# Patient Record
Sex: Male | Born: 1950 | Hispanic: No | Marital: Married | Smoking: Current every day smoker
Health system: Southern US, Community
[De-identification: ages and names within clinical notes are randomized; demographics above are authoritative.]

## PROBLEM LIST (undated history)

## (undated) DIAGNOSIS — I10 Essential (primary) hypertension: Secondary | ICD-10-CM

## (undated) DIAGNOSIS — E785 Hyperlipidemia, unspecified: Secondary | ICD-10-CM

## (undated) DIAGNOSIS — I2510 Atherosclerotic heart disease of native coronary artery without angina pectoris: Secondary | ICD-10-CM

## (undated) HISTORY — PX: EYE SURGERY: SHX253

## (undated) HISTORY — PX: ELBOW SURGERY: SHX618

---

## 1998-03-19 ENCOUNTER — Emergency Department (HOSPITAL_COMMUNITY): Admission: EM | Admit: 1998-03-19 | Discharge: 1998-03-19 | Payer: Self-pay | Admitting: Emergency Medicine

## 1998-03-19 ENCOUNTER — Encounter: Payer: Self-pay | Admitting: Emergency Medicine

## 1999-11-28 ENCOUNTER — Emergency Department (HOSPITAL_COMMUNITY): Admission: EM | Admit: 1999-11-28 | Discharge: 1999-11-28 | Payer: Self-pay | Admitting: *Deleted

## 2000-09-22 ENCOUNTER — Emergency Department (HOSPITAL_COMMUNITY): Admission: EM | Admit: 2000-09-22 | Discharge: 2000-09-23 | Payer: Self-pay | Admitting: Emergency Medicine

## 2000-09-24 ENCOUNTER — Emergency Department: Admission: EM | Admit: 2000-09-24 | Discharge: 2000-09-24 | Payer: Self-pay | Admitting: Emergency Medicine

## 2001-01-13 ENCOUNTER — Emergency Department (HOSPITAL_COMMUNITY): Admission: EM | Admit: 2001-01-13 | Discharge: 2001-01-13 | Payer: Self-pay | Admitting: Emergency Medicine

## 2001-01-13 ENCOUNTER — Encounter: Payer: Self-pay | Admitting: Emergency Medicine

## 2004-04-09 ENCOUNTER — Encounter: Admission: RE | Admit: 2004-04-09 | Discharge: 2004-04-09 | Payer: Self-pay | Admitting: Gastroenterology

## 2004-06-02 ENCOUNTER — Encounter: Admission: RE | Admit: 2004-06-02 | Discharge: 2004-06-02 | Payer: Self-pay | Admitting: Cardiovascular Disease

## 2004-07-01 ENCOUNTER — Ambulatory Visit (HOSPITAL_COMMUNITY): Admission: RE | Admit: 2004-07-01 | Discharge: 2004-07-01 | Payer: Self-pay | Admitting: Cardiovascular Disease

## 2004-07-17 HISTORY — PX: CORONARY ANGIOPLASTY WITH STENT PLACEMENT: SHX49

## 2004-07-30 ENCOUNTER — Ambulatory Visit (HOSPITAL_COMMUNITY): Admission: RE | Admit: 2004-07-30 | Discharge: 2004-07-31 | Payer: Self-pay | Admitting: Cardiovascular Disease

## 2004-10-07 ENCOUNTER — Emergency Department (HOSPITAL_COMMUNITY): Admission: EM | Admit: 2004-10-07 | Discharge: 2004-10-07 | Payer: Self-pay | Admitting: Emergency Medicine

## 2004-12-04 ENCOUNTER — Emergency Department (HOSPITAL_COMMUNITY): Admission: EM | Admit: 2004-12-04 | Discharge: 2004-12-04 | Payer: Self-pay | Admitting: Emergency Medicine

## 2005-01-12 ENCOUNTER — Emergency Department (HOSPITAL_COMMUNITY): Admission: EM | Admit: 2005-01-12 | Discharge: 2005-01-12 | Payer: Self-pay | Admitting: Emergency Medicine

## 2005-01-14 ENCOUNTER — Emergency Department (HOSPITAL_COMMUNITY): Admission: EM | Admit: 2005-01-14 | Discharge: 2005-01-14 | Payer: Self-pay | Admitting: Emergency Medicine

## 2005-01-22 ENCOUNTER — Emergency Department (HOSPITAL_COMMUNITY): Admission: EM | Admit: 2005-01-22 | Discharge: 2005-01-22 | Payer: Self-pay | Admitting: Emergency Medicine

## 2006-04-17 ENCOUNTER — Encounter (INDEPENDENT_AMBULATORY_CARE_PROVIDER_SITE_OTHER): Payer: Self-pay | Admitting: Specialist

## 2006-04-17 ENCOUNTER — Ambulatory Visit (HOSPITAL_BASED_OUTPATIENT_CLINIC_OR_DEPARTMENT_OTHER): Admission: RE | Admit: 2006-04-17 | Discharge: 2006-04-17 | Payer: Self-pay | Admitting: Ophthalmology

## 2006-05-30 ENCOUNTER — Emergency Department (HOSPITAL_COMMUNITY): Admission: EM | Admit: 2006-05-30 | Discharge: 2006-05-30 | Payer: Self-pay | Admitting: Emergency Medicine

## 2007-01-26 ENCOUNTER — Emergency Department (HOSPITAL_COMMUNITY): Admission: EM | Admit: 2007-01-26 | Discharge: 2007-01-26 | Payer: Self-pay | Admitting: Emergency Medicine

## 2007-04-04 IMAGING — CR DG CHEST 2V
2 series · 2 of 2 positions shown · non-contrast
Comparison: none

CLINICAL DATA: Preop respiratory exam for angiogram.
 CHEST X-RAY:
 Two views of the chest show the lungs to be clear and slightly hyperaerated.  The heart is within normal limits in size.  No evidence of acute bony abnormality is seen.

[w chest pa]
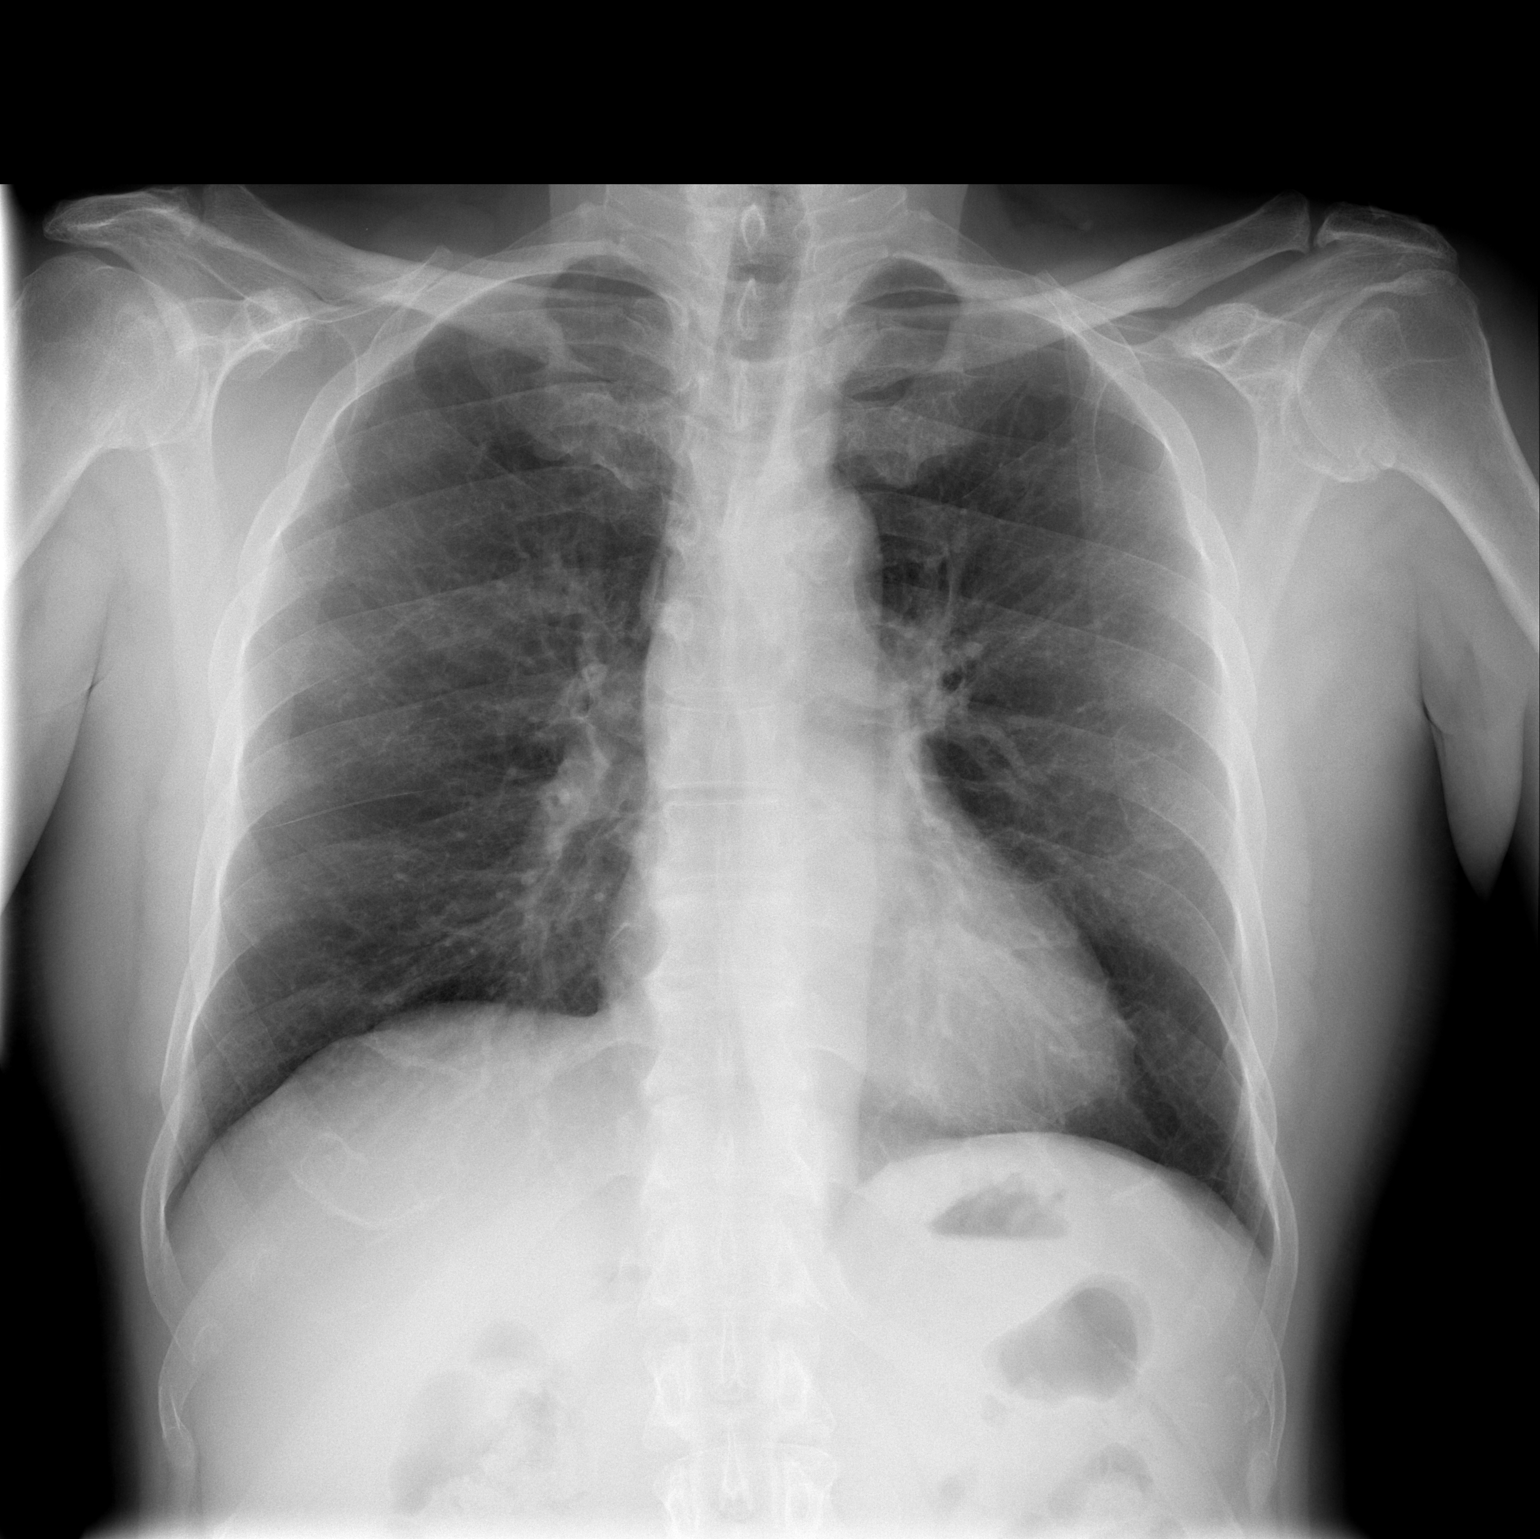

[w chest lat]
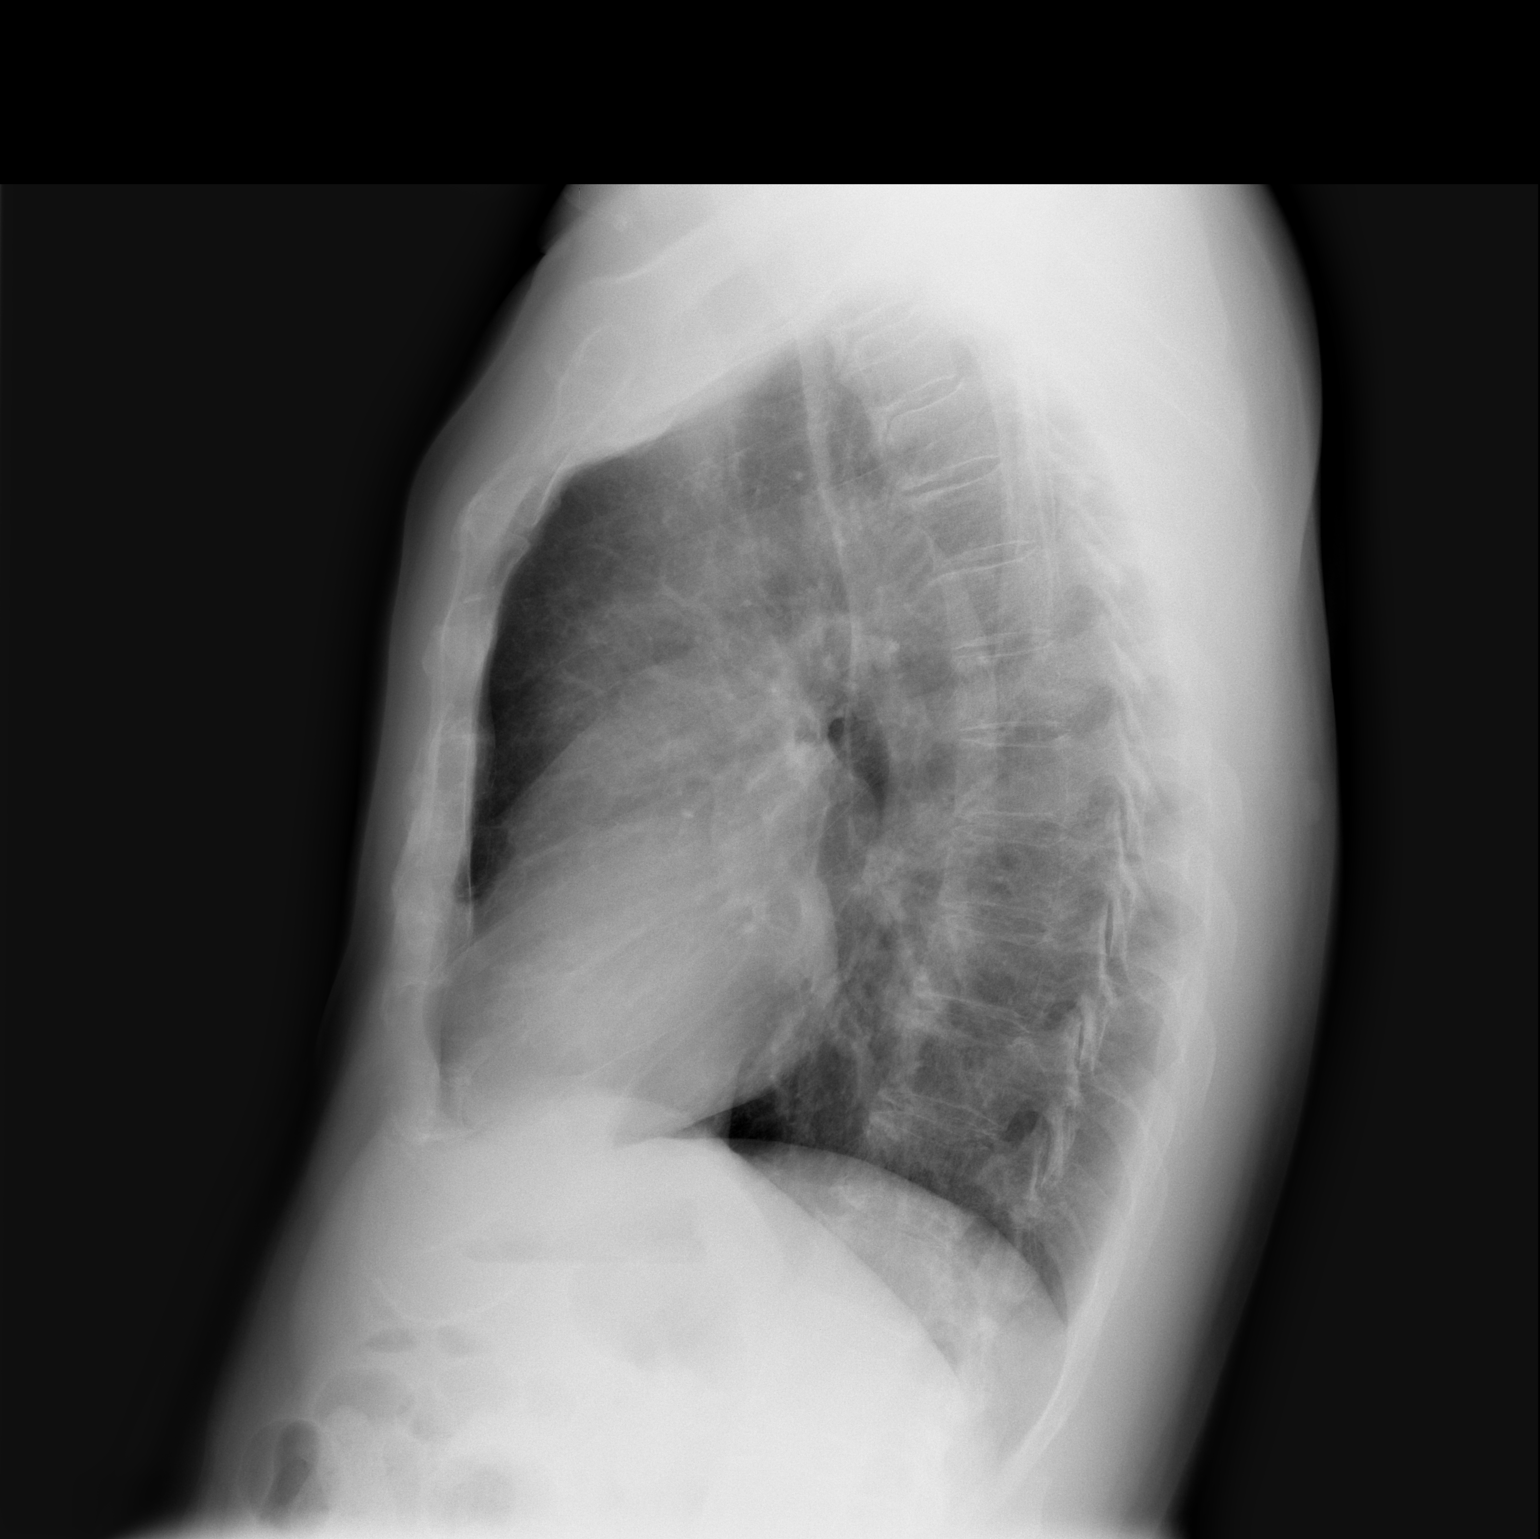

[2 of 2 positions shown; findings below may reference images not displayed]

IMPRESSION: No active lung disease.

## 2009-07-12 ENCOUNTER — Emergency Department (HOSPITAL_COMMUNITY): Admission: EM | Admit: 2009-07-12 | Discharge: 2009-07-12 | Payer: Self-pay | Admitting: Emergency Medicine

## 2009-07-30 ENCOUNTER — Encounter (INDEPENDENT_AMBULATORY_CARE_PROVIDER_SITE_OTHER): Payer: Self-pay | Admitting: Internal Medicine

## 2009-07-30 ENCOUNTER — Ambulatory Visit (HOSPITAL_COMMUNITY): Admission: RE | Admit: 2009-07-30 | Discharge: 2009-07-30 | Payer: Self-pay | Admitting: Internal Medicine

## 2010-06-04 NOTE — Cardiovascular Report (Signed)
NAMELLEWELYN, SHEAFFER               ACCOUNT NO.:  [id number]   MEDICAL RECORD NO.:  [id number]          PATIENT TYPE:  OIB   LOCATION:  2899                         FACILITY:  [facility]   PHYSICIAN:  Nanetta Batty, M.D.   DATE OF BIRTH:  01/05/1951   DATE OF PROCEDURE:  07/01/2004  DATE OF DISCHARGE:                              CARDIAC CATHETERIZATION   Mr. Voit is a 60 year old [place] gentleman referred by Dr. 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  He has had carotid Dopplers performed revealing  occluded right ICA stenosis, with moderate left ICA disease.  He is  neurologically asymptomatic.  He also complains of claudication and has  diminished ABIs, with high-grade SFA disease.  A Cardiolite stress test  shows inferior scar with peri-infarct ischemia.  The patient does complain  of definite angina and has a history of dyslipidemia.  He presents now for  diagnostic coronary arteriography +/- peripheral angiography.  Of note is  the fact that the patient's white count was 15,000, and he has complained of  some dysuria.   PROCEDURE DESCRIPTION:  The patient was brought to the second floor [facility] cardiac catheterization lab in the postabsorptive state.  He was  premedicated with p.o. Valium.  His right groin was prepped and draped in  the usual sterile fashion.  One percent Xylocaine was used for local  anesthesia.  A 6 French sheath was inserted into the right femoral artery  using standard Seldinger technique.  6 French right and left Judkins  diagnostic catheters as well as 6 French pigtail catheter were used for  selective coronary angiography, left ventriculography, subselective left  internal mammary artery angiography, and distal abdominal aortography.  Visipaque dye was used for the entirety of the case.  Retrograde aortic,  left ventricular, and pullback pressures were recorded.   HEMODYNAMIC DATA:  1.  Aortic systolic pressure 157, diastolic pressure 85.  2.  Left  ventricular systolic pressure 159, diastolic pressure 24.   SELECTIVE CORONARY ANGIOGRAPHY:  1.  Left main:  Left main had approximately 30% distal stenosis.  2.  LAD:  The LAD had an 80% segmental stenosis in the proximal third      involving a small diagonal branch.  The LAD was a large vessel that      wrapped the apex and gave right-to-left collaterals to what appears to      be an occluded RCA.  3.  Left circumflex:  Small nondominant, and free of significant disease.  4.  Ramus intermedius branch:  There are two vessels that arise from the      ramus distribution, both of which are moderate in size, distally      bifurcating, and are free of significant disease.  5.  Right coronary artery:  This is a dominant vessel that is occluded      proximally.  There are grade 1 right-to-right and grade 2 left-to-right      collaterals.   LEFT VENTRICULOGRAPHY:  RAO left ventriculogram was performed using 25 cc of  Visipaque dye at 12 cc/second.  The overall LVEF was estimated  at greater  than 60%, with very mild inferior hypokinesia.   LEFT INTERNAL MAMMARY ARTERY:  This vessel was subselectively visualized and  was widely patent.  It is suitable for use in coronary artery bypass  grafting if necessary.   DISTAL ABDOMINAL AORTOGRAPHY:  Distal abdominal aortogram was performed  using 20 cc of Visipaque dye at 20 cc/second.  The renal arteries were  widely patent.  The infrarenal abdominal aorta and iliac bifurcation  appeared free of significant atherosclerotic changes.   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  He will need PCI and  stenting of his proximal LAD.  However, given the fact that his white count  is 15,000, and the etiology of this is unclear, I would prefer to perform  only diagnostic case today, culture his urine, and treat him for a UTI, and  follow his white count prior to implanting  a stent.   The diagnostic catheters were removed.  The sheath was sewn securely in  place.  The patient received 4,000 units of heparin.  He will be brought  upstairs to the [facility] angiographic suite for distal aortography with bifemoral  runoff to define his infrainguinal anatomy.  He left the leg in stable  condition.       JB/MEDQ  D:  07/01/2004  T:  07/01/2004  Job:  [id number]   cc:   Cadiac Catheterization Laboratory  Second Floor   [facility]  [address]  [place] [address]   Fleet Contras, M.D.  [address]  [place]  [place] [address]  Fax: [phone]

## 2010-06-04 NOTE — Cardiovascular Report (Signed)
NAMEZACKARY, MCKEONE               ACCOUNT NO.:  [id number]   MEDICAL RECORD NO.:  [id number]          PATIENT TYPE:  OIB   LOCATION:  6533                         FACILITY:  [facility]   PHYSICIAN:  Nanetta Batty, M.D.   DATE OF BIRTH:  20-Sep-1950   DATE OF PROCEDURE:  07/30/2004  DATE OF DISCHARGE:                              CARDIAC CATHETERIZATION   PROCEDURE:  Percutaneous coronary intervention.   OPERATOR:  Nanetta Batty, M.D.   INDICATIONS FOR PROCEDURE:  Mr. Mcconathy is a 60 year old [place] gentleman  referred by Dr. Fleet Contras for evaluation of PVOD.  Carotid Dopplers  revealed an occluded right IC with moderate left IC stenosis.  He has  diminished ABIs and severe __________ disease by an angiography.  The  Cardiolite revealed inferior scar with moderate pre-infarction ischemia and  the cardiac catheterization revealed high-grade proximal LAD lesion,  segmental proximal ramus lesion and total RCA with left to right  collaterals.  The patient does complain of exertional chest pain and  shortness of breath.  He is assessed now for proximal LAD procedure and  stenting.   DESCRIPTION OF PROCEDURE:  The patient was brought to the second floor [facility] Cardiac Catheterization Laboratory in the post-  absorptive state.  He was premedicated with p.o. Valium.  He was on aspirin  and Plavix as an outpatient.  His right groin was prepped and shaved in the  usual sterile fashion.  Xylocaine 1% was used for local anesthesia.  A 6-  French sheath was inserted into the right femoral artery using a standard  Seldinger technique.  A 6-French sheath was inserted into the right femoral  vein.  A 6-French XP2 guide catheter, 190 Ashahi soft guide wire and a 2.75  mm x 10.0 mm long cutting balloon used for initial conduit atherectomy.  Visipaque dye was used for the entirety of the case.  Retrograde aortic  pressure was monitored throughout the case.  The patient  received an  Angiomax bolus with the ACT documented above the therapeutic range.  He will  received an additional 300 mg of Plavix.   A conduit atherectomy was performed on the proximal LAD lesion with nominal  pressures.  Following this a 3.0 mm x 15.0 mm Taxus drug-eluding stent was  then deployed to 15 or 16 atmospheres and post-dilated with a 3.25 mm x 12.0  mm Quantum, resulting in excellent angiographic result with a reduction of  the 80% proximal LAD lesion to 0% residual.  There did appear to be a distal  edge tear without hemodynamic compromise.  Because of this a 3.5 mm x 8.0 mm  Taxus was then used to distally tack down this tear and was deployed in an  overlapping fashion, resulting in excellent angiographic result.  There was  residual 70%-80% segmental proximal ramus branch stenosis; however, this was  not addressed during this procedure.   IMPRESSION:  Successful proximal left anterior descending coronary artery  conduit atherectomy followed by percutaneous coronary intervention and  stenting using Taxus over-lapping drug-eluding stents.   The patient tolerated the procedure well.  The guide wire and catheter were  removed.  The sheaths were sewn securely in place.   PLAN:  The plan will be to remove the sheaths in two hours.  The patient  will be discharged home in the morning on aspirin and Plavix.  I will see  him back in the office in approximately two weeks for followup.  He left the  laboratory in stable condition.       JB/MEDQ  D:  07/30/2004  T:  07/30/2004  Job:  [id number]   cc:   [facility] - Cardiac Cath Lab   Fleet Contras, M.D.  [address]  [place]  [place] [address]  Fax: [phone]  Email: [email or web address]

## 2010-06-04 NOTE — Cardiovascular Report (Signed)
NAMESHERRILL, MCKAMIE               ACCOUNT NO.:  [id number]   MEDICAL RECORD NO.:  [id number]          PATIENT TYPE:  OIB   LOCATION:  2899                         FACILITY:  [facility]   PHYSICIAN:  Nanetta Batty, M.D.   DATE OF BIRTH:  [birth year]   DATE OF PROCEDURE:  07/01/2004  DATE OF DISCHARGE:                              CARDIAC CATHETERIZATION   Mr. Borboa is a 60 year old [place] male referred by Dr. Concepcion Elk for  PVOD/claudication.  He also has significant carotid disease.  He has  symptoms of effort angina and a positive Cardiolite.  He underwent  diagnostic coronary angiography earlier today revealing a total right  coronary artery with left-to-right collaterals and high-proximal LAD  disease.  He does have a white count of 15,000 of unclear etiology.  He  presents for abdominal aortography with bifemoral runoff.   PROCEDURE DESCRIPTION:  The patient was brought to the sixth floor [facility] peripheral vascular angiographic suite in the postabsorptive state.  He  was premedicated with p.o. Valium.  He was given 1 g of Ancef IV.  His right  groin was prepped and shaved in the usual sterile fashion.  One percent  Xylocaine was used for local anesthesia.  The existing 6 French sheath was  exchanged over a wire using double glove sterile technique.  Number #5  [place] [employer] catheter was used for __________ distal abdominal  aortography with bifemoral runoff using digital subtraction step table  technique.   ANGIOGRAPHIC RESULTS:  1.  Abdominal aorta:  1A.  Renal artery - normal.  1B.  Infrarenal - normal.  1.  Left lower extremity:  2A.  40% ostial left common iliac artery stenosis.  2B.  50% segmental proximal, mid, and distal SFA.  2C.  Two-vessel runoff via the posterior tibialis.  1.  Right lower extremity.  3A.  40% ostial right common iliac artery stenosis.  3B.  50% diffuse segmental external iliac artery stenosis.  3C.  80% segmental proximal SFA, followed  by short segment occlusion and 50%  segment distal SFA.  There is three-vessel runoff.   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  He  will need to have his LAD fixed first, and then we will address his SFA  disease.   __________ the sheaths were removed.  Pressure on the groin achieved  hemostasis.  The patient left the lab in stable condition.  He will be  hydrated and discharged home today as an outpatient.  We will see him back  in three weeks for followup.       JB/MEDQ  D:  07/01/2004  T:  07/01/2004  Job:  [id number]   cc:   Peripheral Vascular Angiographic Suite  Sixth Floor   [facility]  [address]  [place] [address]   Fleet Contras, M.D.  [address]  [place]  [place] [address]  Fax: [phone]

## 2010-10-06 LAB — URINE MICROSCOPIC-ADD ON

## 2010-10-06 LAB — DIFFERENTIAL
Basophils Absolute: 0
Basophils Relative: 0
Eosinophils Absolute: 0.2
Neutro Abs: 9.4 — ABNORMAL HIGH
Neutrophils Relative %: 77

## 2010-10-06 LAB — COMPREHENSIVE METABOLIC PANEL
Alkaline Phosphatase: 105
BUN: 14
CO2: 26
Chloride: 109
Glucose, Bld: 107 — ABNORMAL HIGH
Potassium: 4
Total Bilirubin: 0.4

## 2010-10-06 LAB — URINALYSIS, ROUTINE W REFLEX MICROSCOPIC
Hgb urine dipstick: NEGATIVE
Ketones, ur: NEGATIVE
Leukocytes, UA: NEGATIVE
Protein, ur: 30 — AB
Urobilinogen, UA: 0.2

## 2010-10-06 LAB — CBC
HCT: 42.5
Hemoglobin: 14.3
RBC: 5.12
WBC: 12.3 — ABNORMAL HIGH

## 2010-12-20 ENCOUNTER — Emergency Department (HOSPITAL_COMMUNITY): Payer: Medicare Other

## 2010-12-20 ENCOUNTER — Other Ambulatory Visit: Payer: Self-pay

## 2010-12-20 ENCOUNTER — Emergency Department (HOSPITAL_COMMUNITY)
Admission: EM | Admit: 2010-12-20 | Discharge: 2010-12-20 | Disposition: A | Discharge status: Home / Self Care | Payer: Medicare Other | Attending: Emergency Medicine | Admitting: Emergency Medicine

## 2010-12-20 DIAGNOSIS — R509 Fever, unspecified: Secondary | ICD-10-CM | POA: Insufficient documentation

## 2010-12-20 DIAGNOSIS — R059 Cough, unspecified: Secondary | ICD-10-CM | POA: Insufficient documentation

## 2010-12-20 DIAGNOSIS — R000 Tachycardia, unspecified: Secondary | ICD-10-CM | POA: Insufficient documentation

## 2010-12-20 DIAGNOSIS — R0602 Shortness of breath: Secondary | ICD-10-CM | POA: Insufficient documentation

## 2010-12-20 DIAGNOSIS — R062 Wheezing: Secondary | ICD-10-CM | POA: Insufficient documentation

## 2010-12-20 DIAGNOSIS — R05 Cough: Secondary | ICD-10-CM | POA: Insufficient documentation

## 2010-12-20 DIAGNOSIS — J40 Bronchitis, not specified as acute or chronic: Secondary | ICD-10-CM

## 2010-12-20 HISTORY — DX: Essential (primary) hypertension: I10

## 2010-12-20 LAB — POCT I-STAT, CHEM 8
Chloride: 102 mEq/L (ref 96–112)
Glucose, Bld: 96 mg/dL (ref 70–99)
HCT: 38 % — ABNORMAL LOW (ref 39.0–52.0)
Hemoglobin: 12.9 g/dL — ABNORMAL LOW (ref 13.0–17.0)
Potassium: 3.1 mEq/L — ABNORMAL LOW (ref 3.5–5.1)
Sodium: 140 mEq/L (ref 135–145)

## 2010-12-20 MED ORDER — IPRATROPIUM BROMIDE 0.02 % IN SOLN
0.5000 mg | RESPIRATORY_TRACT | Status: AC
  Administered 2010-12-20: 0.5 mg via RESPIRATORY_TRACT
  Filled 2010-12-20: qty 2.5

## 2010-12-20 MED ORDER — ALBUTEROL SULFATE HFA 108 (90 BASE) MCG/ACT IN AERS: 1.0000 | INHALATION_SPRAY | Freq: Four times a day (QID) | RESPIRATORY_TRACT | Status: DC | PRN

## 2010-12-20 MED ORDER — ALBUTEROL SULFATE (5 MG/ML) 0.5% IN NEBU
2.5000 mg | INHALATION_SOLUTION | RESPIRATORY_TRACT | Status: AC
  Administered 2010-12-20: 2.5 mg via RESPIRATORY_TRACT
  Filled 2010-12-20: qty 0.5

## 2010-12-20 MED ORDER — POTASSIUM CHLORIDE 20 MEQ/15ML (10%) PO LIQD
40.0000 meq | Freq: Once | ORAL | Status: AC
  Administered 2010-12-20: 40 meq via ORAL
  Filled 2010-12-20: qty 30

## 2010-12-20 MED ORDER — HYDROCODONE-ACETAMINOPHEN 5-325 MG PO TABS: 2.0000 | ORAL_TABLET | ORAL | Status: AC | PRN

## 2010-12-20 MED ORDER — AZITHROMYCIN 250 MG PO TABS: 250.0000 mg | ORAL_TABLET | Freq: Every day | ORAL | Status: AC

## 2010-12-20 MED ORDER — SODIUM CHLORIDE 0.9 % IV SOLN
Freq: Once | INTRAVENOUS | Status: AC
  Administered 2010-12-20: 75 mL/h via INTRAVENOUS

## 2010-12-20 MED ORDER — ALBUTEROL SULFATE (5 MG/ML) 0.5% IN NEBU
5.0000 mg | INHALATION_SOLUTION | RESPIRATORY_TRACT | Status: AC
  Administered 2010-12-20: 5 mg via RESPIRATORY_TRACT
  Filled 2010-12-20: qty 1

## 2010-12-20 MED ORDER — METHYLPREDNISOLONE 4 MG PO KIT: PACK | ORAL | Status: AC

## 2010-12-20 MED ORDER — OXYCODONE-ACETAMINOPHEN 5-325 MG PO TABS
1.0000 | ORAL_TABLET | Freq: Once | ORAL | Status: AC
  Administered 2010-12-20: 1 via ORAL
  Filled 2010-12-20: qty 1

## 2010-12-20 MED ORDER — IBUPROFEN 800 MG PO TABS
800.0000 mg | ORAL_TABLET | Freq: Once | ORAL | Status: AC
  Administered 2010-12-20: 800 mg via ORAL
  Filled 2010-12-20: qty 1

## 2010-12-20 MED ORDER — ACETAMINOPHEN 325 MG PO TABS
650.0000 mg | ORAL_TABLET | Freq: Once | ORAL | Status: AC
  Administered 2010-12-20: 650 mg via ORAL
  Filled 2010-12-20: qty 2

## 2010-12-20 MED ORDER — IOHEXOL 300 MG/ML  SOLN
90.0000 mL | Freq: Once | INTRAMUSCULAR | Status: AC | PRN
  Administered 2010-12-20: 90 mL via INTRAVENOUS

## 2010-12-20 NOTE — ED Notes (Signed)
CDU unable to take pt at this time. No beds, will talk with RN when one is open.

## 2010-12-20 NOTE — ED Provider Notes (Signed)
2:41 PM Patient is in CDU holding for d-dimer, further evaluation and treatment.  Pt with nonproductive cough, fever, persistent tachycardia.  Pt reports he is feeling better, SOB and cough have improved.  Patient now 92% on room air.  On exam, pt is A&Ox4, NAD, RRR, lungs with mild wheezing, decreased air movement bilaterally.  I have ordered second nebulizer treatment and will put on [place].  Will continue to follow.    3:11 PM Patient discussed with Grant Fontana, PA-C, who assumes care of patient at change of shift.    Rise Patience, [place] 12/20/10 [phone]

## 2010-12-20 NOTE — ED Notes (Signed)
Neb completed. Will advise ct pt is ready for scan

## 2010-12-20 NOTE — ED Notes (Signed)
Pt states he is ready to go home . That he has been here too much time and is time to go. Pa is aware. Have tried to let pt know we are only awaiting his ct result.

## 2010-12-20 NOTE — ED Notes (Signed)
Chills, fever with a non-productive cough for 3 days, body aches

## 2010-12-20 NOTE — ED Provider Notes (Signed)
A 3 days of shortness of breath. He denies cough fever or chest pain to me. There may be some language barrier. Patient is alert and cooperative and in no respiratory distress. CT MG of chest pending because of elevated d-dimer  Medical screening examination/treatment/procedure(s) were conducted as a shared visit with non-physician practitioner(s) and myself.  I personally evaluated the patient during the encounter Devoria Albe, MD, Franz Dell, MD 12/20/10 [phone]

## 2010-12-20 NOTE — ED Notes (Signed)
Patient is resting comfortably. 

## 2010-12-20 NOTE — ED Notes (Signed)
MEAL ORDERED

## 2010-12-20 NOTE — ED Notes (Signed)
CALLED LAB TO INQUIRE ABOUT DDIMER NOT RESULTED. PER [place]TA IN LAB THEY HAVE NOT RECEIVED SAMPLE. THEY WILL CONTACT THE PHLEBOTOMIST THAT COLLECTED THE SAMPLE AND CALL BACK

## 2010-12-20 NOTE — ED Notes (Signed)
PER JAQUETTA SAMPLE WILL BE REDRAWN

## 2012-10-22 ENCOUNTER — Other Ambulatory Visit: Payer: Self-pay | Admitting: Internal Medicine

## 2012-10-22 DIAGNOSIS — J449 Chronic obstructive pulmonary disease, unspecified: Secondary | ICD-10-CM

## 2012-11-02 ENCOUNTER — Other Ambulatory Visit: Payer: Medicare Other

## 2013-04-13 ENCOUNTER — Emergency Department (HOSPITAL_COMMUNITY): Payer: Medicare Other

## 2013-04-13 ENCOUNTER — Encounter (HOSPITAL_COMMUNITY): Payer: Self-pay | Admitting: Emergency Medicine

## 2013-04-13 ENCOUNTER — Emergency Department (HOSPITAL_COMMUNITY)
Admission: EM | Admit: 2013-04-13 | Discharge: 2013-04-13 | Disposition: A | Discharge status: Home / Self Care | Payer: Medicare Other | Attending: Emergency Medicine | Admitting: Emergency Medicine

## 2013-04-13 DIAGNOSIS — J069 Acute upper respiratory infection, unspecified: Secondary | ICD-10-CM | POA: Insufficient documentation

## 2013-04-13 DIAGNOSIS — I10 Essential (primary) hypertension: Secondary | ICD-10-CM | POA: Insufficient documentation

## 2013-04-13 DIAGNOSIS — Z79899 Other long term (current) drug therapy: Secondary | ICD-10-CM | POA: Insufficient documentation

## 2013-04-13 DIAGNOSIS — Z7902 Long term (current) use of antithrombotics/antiplatelets: Secondary | ICD-10-CM | POA: Insufficient documentation

## 2013-04-13 DIAGNOSIS — R000 Tachycardia, unspecified: Secondary | ICD-10-CM | POA: Insufficient documentation

## 2013-04-13 DIAGNOSIS — J45909 Unspecified asthma, uncomplicated: Secondary | ICD-10-CM | POA: Insufficient documentation

## 2013-04-13 DIAGNOSIS — F17200 Nicotine dependence, unspecified, uncomplicated: Secondary | ICD-10-CM | POA: Insufficient documentation

## 2013-04-13 MED ORDER — BENZONATATE 100 MG PO CAPS: 100.0000 mg | ORAL_CAPSULE | Freq: Three times a day (TID) | ORAL | Status: DC

## 2013-04-13 MED ORDER — GUAIFENESIN 100 MG/5ML PO LIQD: 100.0000 mg | ORAL | Status: DC | PRN

## 2013-04-13 MED ORDER — BENZONATATE 100 MG PO CAPS
100.0000 mg | ORAL_CAPSULE | Freq: Once | ORAL | Status: AC
  Administered 2013-04-13: 100 mg via ORAL
  Filled 2013-04-13: qty 1

## 2013-04-13 NOTE — ED Provider Notes (Signed)
CSN: [id number]     Arrival date & time 04/13/13  [id number] History   First MD Initiated Contact with Patient 04/13/13 0959    This chart was scribed for Fayrene HelperBowie Mayuri Staples PA-C, a non-physician practitioner working with Gerhard Munchobert Lockwood, MD by Lewanda RifeAlexandra Hurtado, ED Scribe. This patient was seen in room TR09C/TR09C and the patient's care was started at 10:00 AM     Chief Complaint  Patient presents with  . Cough     (Consider location/radiation/quality/duration/timing/severity/associated sxs/prior Treatment) The history is provided by the patient. No language interpreter was used.   HPI Comments: Kevin Robinson is a 63 y.o. male who presents to the Emergency Department with PMHx of asthma complaining of non-productive cough onset 4 days. Reports associated subjective fever, sore throat, rhinorrhea, sneezing, insomnia,  Denies associated otalgia, rash, shortness of breath, Denies sick contacts. Reports he quit smoking 4 days ago.   Past Medical History  Diagnosis Date  . Asthma   . Hypertension    History reviewed. No pertinent past surgical history. History reviewed. No pertinent family history. History  Substance Use Topics  . Smoking status: Current Every Day Smoker  . Smokeless tobacco: Never Used  . Alcohol Use: No    Review of Systems  All other systems reviewed and are negative.      Allergies  Review of patient's allergies indicates no known allergies.  Home Medications   Current Outpatient Rx  Name  Route  Sig  Dispense  Refill  . EXPIRED: albuterol (PROVENTIL HFA;VENTOLIN HFA) 108 (90 BASE) MCG/ACT inhaler   Inhalation   Inhale 1-2 puffs into the lungs every 6 (six) hours as needed for wheezing.   1 Inhaler   0   . amLODipine (NORVASC) 10 MG tablet   Oral   Take 10 mg by mouth at bedtime.           . clopidogrel (PLAVIX) 75 MG tablet   Oral   Take 75 mg by mouth at bedtime.           Marland Kitchen. losartan-hydrochlorothiazide (HYZAAR) 100-12.5 MG per tablet   Oral  Take 1 tablet by mouth at bedtime.           . rosuvastatin (CRESTOR) 10 MG tablet   Oral   Take 10 mg by mouth at bedtime.            BP 107/58  Pulse 102  Temp(Src) 98 F (36.7 C) (Oral)  Resp 24  SpO2 96% Physical Exam  Nursing note and vitals reviewed. Constitutional: He is oriented to person, place, and time. He appears well-developed and well-nourished. No distress.  HENT:  Head: Normocephalic and atraumatic.  Mouth/Throat: Uvula is midline, oropharynx is clear and moist and mucous membranes are normal. No oropharyngeal exudate, posterior oropharyngeal edema, posterior oropharyngeal erythema or tonsillar abscesses.  Mild postnasal drainage   Eyes: EOM are normal.  Neck: Neck supple. No tracheal deviation present.  Cardiovascular: Regular rhythm.  Tachycardia present.   Pulmonary/Chest: Effort normal and breath sounds normal. No respiratory distress. He has no wheezes. He has no rales.  Musculoskeletal: Normal range of motion.  Neurological: He is alert and oriented to person, place, and time.  Skin: Skin is warm and dry.  Psychiatric: He has a normal mood and affect. His behavior is normal.    ED Course  Procedures  COORDINATION OF CARE:  Nursing notes reviewed. Vital signs reviewed. Initial pt interview and examination performed.   10:06 AM-Discussed work up plan with pt  at bedside, which includes  . Pt agrees with plan.  Pt here with URI sxs.  Has persistent cough.  No medication to cause ACEi cough.  He's on an ARB Hyzaar.  CXR neg, no hypoxia, is afebrile.  Will d/c with cough medication.  Return precaution discussed.     Treatment plan initiated:Medications - No data to display   Initial diagnostic testing ordered.     Labs Review Labs Reviewed - No data to display Imaging Review Dg Chest 2 View (if Patient Has Fever And/or Copd)  04/13/2013   CLINICAL DATA:  Productive cough.  EXAM: CHEST  2 VIEW  COMPARISON:  CT ANGIO CHEST W/CM &/OR WO/CM dated  12/20/2010; DG CHEST 2 VIEW dated 12/20/2010  FINDINGS: Lungs are clear bilaterally. Lung markings are prominent but unchanged. Heart and mediastinum are within normal limits. Atherosclerotic calcifications at the aortic arch. No acute bone abnormality.  IMPRESSION: No active cardiopulmonary disease.   Electronically Signed   By: Richarda Overlie M.D.   On: 04/13/2013 11:52     EKG Interpretation None      MDM   Final diagnoses:  URI (upper respiratory infection)   BP 107/58  Pulse 102  Temp(Src) 98 F (36.7 C) (Oral)  Resp 24  SpO2 96%  I have reviewed nursing notes and vital signs. I personally reviewed the imaging tests through PACS system  I reviewed available ER/hospitalization records thought the EMR  I personally performed the services described in this documentation, which was scribed in my presence. The recorded information has been reviewed and is accurate.     Fayrene Helper, PA-C 04/13/13 1215

## 2013-04-13 NOTE — Discharge Instructions (Signed)

## 2013-04-13 NOTE — ED Notes (Signed)
Pt reports hes had a cough, fevers and unable to sleep for the past 4 days. hes a&ox4, breathing easily now

## 2013-04-13 NOTE — ED Notes (Signed)
Xray called to get pt 

## 2013-04-13 NOTE — ED Notes (Signed)
PT ambulated with baseline gait; VSS; A&Ox3; no signs of distress; respirations even and unlabored; skin warm and dry; no questions upon discharge.  

## 2013-04-13 NOTE — ED Provider Notes (Signed)
  Medical screening examination/treatment/procedure(s) were performed by non-physician practitioner and as supervising physician I was immediately available for consultation/collaboration.   EKG Interpretation None         Gerhard Munchobert Dori Devino, MD 04/13/13 [phone]

## 2013-04-13 NOTE — ED Notes (Signed)
Patient transported to X-ray 

## 2013-10-23 ENCOUNTER — Emergency Department (HOSPITAL_COMMUNITY): Payer: Medicare Other

## 2013-10-23 ENCOUNTER — Encounter (HOSPITAL_COMMUNITY): Payer: Self-pay | Admitting: Emergency Medicine

## 2013-10-23 ENCOUNTER — Inpatient Hospital Stay (HOSPITAL_COMMUNITY)
Admission: EM | Admit: 2013-10-23 | Discharge: 2013-10-24 | DRG: 684 | Disposition: A | Discharge status: Home / Self Care | Payer: Medicare Other | Attending: Internal Medicine | Admitting: Internal Medicine

## 2013-10-23 DIAGNOSIS — Z9889 Other specified postprocedural states: Secondary | ICD-10-CM

## 2013-10-23 DIAGNOSIS — Y92009 Unspecified place in unspecified non-institutional (private) residence as the place of occurrence of the external cause: Secondary | ICD-10-CM | POA: Diagnosis not present

## 2013-10-23 DIAGNOSIS — Z79899 Other long term (current) drug therapy: Secondary | ICD-10-CM | POA: Diagnosis not present

## 2013-10-23 DIAGNOSIS — Z7902 Long term (current) use of antithrombotics/antiplatelets: Secondary | ICD-10-CM | POA: Diagnosis not present

## 2013-10-23 DIAGNOSIS — T464X5A Adverse effect of angiotensin-converting-enzyme inhibitors, initial encounter: Secondary | ICD-10-CM | POA: Diagnosis present

## 2013-10-23 DIAGNOSIS — Z6825 Body mass index (BMI) 25.0-25.9, adult: Secondary | ICD-10-CM

## 2013-10-23 DIAGNOSIS — J45909 Unspecified asthma, uncomplicated: Secondary | ICD-10-CM | POA: Diagnosis present

## 2013-10-23 DIAGNOSIS — R197 Diarrhea, unspecified: Secondary | ICD-10-CM

## 2013-10-23 DIAGNOSIS — N179 Acute kidney failure, unspecified: Principal | ICD-10-CM | POA: Diagnosis present

## 2013-10-23 DIAGNOSIS — R630 Anorexia: Secondary | ICD-10-CM | POA: Diagnosis present

## 2013-10-23 DIAGNOSIS — D72829 Elevated white blood cell count, unspecified: Secondary | ICD-10-CM | POA: Diagnosis present

## 2013-10-23 DIAGNOSIS — Z87891 Personal history of nicotine dependence: Secondary | ICD-10-CM | POA: Diagnosis not present

## 2013-10-23 DIAGNOSIS — I10 Essential (primary) hypertension: Secondary | ICD-10-CM | POA: Diagnosis present

## 2013-10-23 LAB — COMPREHENSIVE METABOLIC PANEL
ALBUMIN: 4.6 g/dL (ref 3.5–5.2)
ALK PHOS: 113 U/L (ref 39–117)
ALT: 13 U/L (ref 0–53)
ANION GAP: 19 — AB (ref 5–15)
AST: 13 U/L (ref 0–37)
BILIRUBIN TOTAL: 0.4 mg/dL (ref 0.3–1.2)
BUN: 37 mg/dL — AB (ref 6–23)
CHLORIDE: 102 meq/L (ref 96–112)
CO2: 18 meq/L — AB (ref 19–32)
Calcium: 9.9 mg/dL (ref 8.4–10.5)
Creatinine, Ser: 3.44 mg/dL — ABNORMAL HIGH (ref 0.50–1.35)
GFR calc Af Amer: 20 mL/min — ABNORMAL LOW (ref >90)
GFR calc non Af Amer: 18 mL/min — ABNORMAL LOW (ref >90)
Glucose, Bld: 155 mg/dL — ABNORMAL HIGH (ref 70–99)
POTASSIUM: 4.2 meq/L (ref 3.7–5.3)
Sodium: 139 mEq/L (ref 137–147)
Total Protein: 8.6 g/dL — ABNORMAL HIGH (ref 6.0–8.3)

## 2013-10-23 LAB — CBC WITH DIFFERENTIAL/PLATELET
BASOS PCT: 0 % (ref 0–1)
Basophils Absolute: 0 10*3/uL (ref 0.0–0.1)
Eosinophils Absolute: 0.2 10*3/uL (ref 0.0–0.7)
Eosinophils Relative: 1 % (ref 0–5)
HCT: 45.9 % (ref 39.0–52.0)
HEMOGLOBIN: 15.2 g/dL (ref 13.0–17.0)
LYMPHS ABS: 2.4 10*3/uL (ref 0.7–4.0)
LYMPHS PCT: 10 % — AB (ref 12–46)
MCH: 28.3 pg (ref 26.0–34.0)
MCHC: 33.1 g/dL (ref 30.0–36.0)
MCV: 85.5 fL (ref 78.0–100.0)
MONOS PCT: 5 % (ref 3–12)
Monocytes Absolute: 1.2 10*3/uL — ABNORMAL HIGH (ref 0.1–1.0)
NEUTROS ABS: 19.4 10*3/uL — AB (ref 1.7–7.7)
NEUTROS PCT: 84 % — AB (ref 43–77)
Platelets: 326 10*3/uL (ref 150–400)
RBC: 5.37 MIL/uL (ref 4.22–5.81)
RDW: 14.8 % (ref 11.5–15.5)
WBC: 23.3 10*3/uL — AB (ref 4.0–10.5)

## 2013-10-23 LAB — I-STAT TROPONIN, ED: Troponin i, poc: 0.01 ng/mL (ref 0.00–0.08)

## 2013-10-23 LAB — LIPASE, BLOOD: Lipase: 37 U/L (ref 11–59)

## 2013-10-23 MED ORDER — MORPHINE SULFATE 4 MG/ML IJ SOLN
4.0000 mg | Freq: Once | INTRAMUSCULAR | Status: AC
  Administered 2013-10-23: 4 mg via INTRAVENOUS
  Filled 2013-10-23: qty 1

## 2013-10-23 MED ORDER — ONDANSETRON HCL 4 MG/2ML IJ SOLN
4.0000 mg | Freq: Once | INTRAMUSCULAR | Status: AC
  Administered 2013-10-23: 4 mg via INTRAVENOUS
  Filled 2013-10-23: qty 2

## 2013-10-23 MED ORDER — SODIUM CHLORIDE 0.9 % IV BOLUS (SEPSIS)
1000.0000 mL | Freq: Once | INTRAVENOUS | Status: AC
  Administered 2013-10-23: 1000 mL via INTRAVENOUS

## 2013-10-23 MED ORDER — SODIUM CHLORIDE 0.9 % IV BOLUS (SEPSIS): 1000.0000 mL | Freq: Once | INTRAVENOUS | Status: DC

## 2013-10-23 NOTE — H&P (Signed)
[facility] History and Physical  Kevin NunnerySharif A Carlo HYQ:[id number] DOB: 10/13/1950 DOA: 10/23/2013  Referring physician: Azalia BilisKevin Campos, MD PCP: No primary provider on file.   Chief Complaint: Diarrhea  HPI: Kevin Robinson is a 63 y.o. male presents with diarrhea. He states that he was his usual health until the last three days. Patient states he has been having diarrhea which is watery. He states that he has no mucus and no blood noted in the stool. He also has had a loss of appetite. In addition to this he has had some abdominal cramping. Patient has not noted fevers. He denies any sick contacts. He has not traveled abroad. He states that he has only been taking his usual medications for his blood pressure. He has no headaches and no chest pain and no abdominal pain.   Review of Systems:  Constitutional:  No weight loss, night sweats, Fevers, chills, ++fatigue.  HEENT:  No headaches, No sneezing, itching, ear ache, nasal congestion, post nasal drip,  Cardio-vascular:  No chest pain, Orthopnea, PND, swelling in lower extremities  GI:  No heartburn, indigestion, abdominal pain, nausea, vomiting, ++diarrhea, ++change in bowel habits, ++loss of appetite  Resp:  No shortness of breath with exertion or at rest. No excess mucus, no productive cough, No non-productive cough, No coughing up of blood Skin:  no rash or lesions GU:  no dysuria, change in color of urine.  Musculoskeletal:  No joint pain or swelling. No decreased range of motion. No back pain.  Psych:  No change in mood or affect. No depression or anxiety. No memory loss.   Past Medical History  Diagnosis Date  . Asthma   . Hypertension    Past Surgical History  Procedure Laterality Date  . Cardiac catheterization      10 years ago   Social History:  reports that he has quit smoking. He has never used smokeless tobacco. He reports that he does not drink alcohol or use illicit drugs.  No Known Allergies  No family  history on file.   Prior to Admission medications   Medication Sig Start Date End Date Taking? Authorizing Provider  amLODipine (NORVASC) 10 MG tablet Take 10 mg by mouth at bedtime.     Yes Historical Provider, MD  clopidogrel (PLAVIX) 75 MG tablet Take 75 mg by mouth at bedtime.     Yes Historical Provider, MD  losartan-hydrochlorothiazide (HYZAAR) 100-25 MG per tablet Take 1 tablet by mouth at bedtime.    Yes Historical Provider, MD  rosuvastatin (CRESTOR) 10 MG tablet Take 10 mg by mouth at bedtime.     Yes Historical Provider, MD   Physical Exam: Filed Vitals:   10/23/13 2006 10/23/13 2105  BP: 119/61   Pulse: 96   Temp: 97.5 F (36.4 C)   TempSrc: Oral   Resp: 18   Height:  5\' 7"  (1.702 m)  Weight:  74.844 kg (165 lb)  SpO2: 97%     Wt Readings from Last 3 Encounters:  10/23/13 74.844 kg (165 lb)    General:  Appears calm and comfortable Eyes: PERRL, normal lids, irises & conjunctiva ENT: grossly normal hearing, lips & tongue Neck: no LAD, masses or thyromegaly Cardiovascular: RRR, no m/r/g. No LE edema. Respiratory: CTA bilaterally, no w/r/r. Normal respiratory effort. Abdomen: soft, ntnd Skin: no rash or induration seen on limited exam Musculoskeletal: grossly normal tone BUE/BLE Psychiatric: grossly normal mood and affect, speech fluent and appropriate Neurologic: grossly non-focal.  Labs on Admission:  Basic Metabolic Panel:  Recent Labs Lab 10/23/13 2120  NA 139  K 4.2  CL 102  CO2 18*  GLUCOSE 155*  BUN 37*  CREATININE 3.44*  CALCIUM 9.9   Liver Function Tests:  Recent Labs Lab 10/23/13 2120  AST 13  ALT 13  ALKPHOS 113  BILITOT 0.4  PROT 8.6*  ALBUMIN 4.6    Recent Labs Lab 10/23/13 2120  LIPASE 37   No results found for this basename: AMMONIA,  in the last 168 hours CBC:  Recent Labs Lab 10/23/13 2120  WBC 23.3*  NEUTROABS 19.4*  HGB 15.2  HCT 45.9  MCV 85.5  PLT 326   Cardiac Enzymes: No results found for  this basename: CKTOTAL, CKMB, CKMBINDEX, TROPONINI,  in the last 168 hours  BNP (last 3 results) No results found for this basename: PROBNP,  in the last 8760 hours CBG: No results found for this basename: GLUCAP,  in the last 168 hours  Radiological Exams on Admission: Dg Chest 2 View  10/23/2013   CLINICAL DATA:  Very and vomiting for 3 days. Initial encounter. History of hypertension.  EXAM: CHEST  2 VIEW  COMPARISON:  Two-view chest 04/13/2013.  FINDINGS: Heart size is normal. The lungs are clear. Emphysematous changes again noted. The visualized soft tissues and bony thorax are unremarkable.  IMPRESSION: No active cardiopulmonary disease.   Electronically Signed   By: Gennette Pac M.D.   On: 10/23/2013 21:53      Assessment/Plan Active Problems:   AKI (acute kidney injury)   Hypertension   Asthma   Diarrhea   Acute renal failure   1. Acute Renal Failure -likely related to his diarrhea and poor PO intake for the last 3 days -will hydrate with IV NS and monitor labs -monitor IOs -if there is no improvement then consider further renal workup  2. Diarrhea -will start on oral cipro -will check stool O&P -will check stool for C Diff also -again hydration ordered  3. ASthma -clinically stable  -will monitor  4. Hypertension -will continue with home medications   Code Status: Full Code (must indicate code status--if unknown or must be presumed, indicate so) DVT Prophylaxis:Heparin Family Communication: Daughter (indicate person spoken with, if applicable, with phone number if by telephone) Disposition Plan: Home (indicate anticipated LOS)  Time spent:  [facility] Pager [phone]

## 2013-10-23 NOTE — ED Provider Notes (Signed)
CSN: [id number]     Arrival date & time 10/23/13  1948 History   First MD Initiated Contact with Patient 10/23/13 2229     Chief Complaint  Patient presents with  . Diarrhea     (Consider location/radiation/quality/duration/timing/severity/associated sxs/prior Treatment) HPI Comments: Patient is a 63 year old male with history of asthma and hypertension who presents to the emergency department for evaluation of 3 days of diarrhea. He reports that he has been having watery diarrhea. He began to have emesis today. He has associated heartburn and began to lose his voice today. He denies any unusual food intake, recent travel, recent antibiotics. He has history of cardiac cath 10 years ago. He was having an echo done by his primary care physician one month ago which she was told was normal.  The history is provided by the patient and a relative. The history is limited by a language barrier. A language interpreter was used.    Past Medical History  Diagnosis Date  . Asthma   . Hypertension    Past Surgical History  Procedure Laterality Date  . Cardiac catheterization      10 years ago   No family history on file. History  Substance Use Topics  . Smoking status: Former Games developer  . Smokeless tobacco: Never Used  . Alcohol Use: No    Review of Systems  Constitutional: Negative for fever and chills.  Respiratory: Negative for shortness of breath.   Cardiovascular: Positive for chest pain (burning).  Gastrointestinal: Positive for nausea, vomiting, abdominal pain and diarrhea.  Genitourinary: Negative for dysuria and frequency.  All other systems reviewed and are negative.     Allergies  Review of patient's allergies indicates no known allergies.  Home Medications   Prior to Admission medications   Medication Sig Start Date End Date Taking? Authorizing Provider  amLODipine (NORVASC) 10 MG tablet Take 10 mg by mouth at bedtime.     Yes Historical Provider, MD  clopidogrel  (PLAVIX) 75 MG tablet Take 75 mg by mouth at bedtime.     Yes Historical Provider, MD  losartan-hydrochlorothiazide (HYZAAR) 100-25 MG per tablet Take 1 tablet by mouth at bedtime.    Yes Historical Provider, MD  rosuvastatin (CRESTOR) 10 MG tablet Take 10 mg by mouth at bedtime.     Yes Historical Provider, MD   BP 119/61  Pulse 96  Temp(Src) 97.5 F (36.4 C) (Oral)  Resp 18  Ht 5\' 7"  (1.702 m)  Wt 165 lb (74.844 kg)  BMI 25.84 kg/m2  SpO2 97% Physical Exam  Nursing note and vitals reviewed. Constitutional: He is oriented to person, place, and time. He appears well-developed and well-nourished. He appears ill. No distress.  HENT:  Head: Normocephalic and atraumatic.  Right Ear: External ear normal.  Left Ear: External ear normal.  Nose: Nose normal.  Mouth/Throat: Mucous membranes are dry.  Eyes: Conjunctivae are normal.  Neck: Normal range of motion. No tracheal deviation present.  Cardiovascular: Normal rate, regular rhythm and normal heart sounds.   Pulmonary/Chest: Effort normal and breath sounds normal. No stridor.  Abdominal: Soft. Bowel sounds are normal. He exhibits no distension. There is no tenderness. There is no rigidity, no rebound and no guarding.  Musculoskeletal: Normal range of motion.  Neurological: He is alert and oriented to person, place, and time.  Skin: Skin is warm and dry. He is not diaphoretic.  Psychiatric: He has a normal mood and affect. His behavior is normal.    ED Course  Procedures (  including critical care time) Labs Review Labs Reviewed  CBC WITH DIFFERENTIAL - Abnormal; Notable for the following:    WBC 23.3 (*)    Neutrophils Relative % 84 (*)    Neutro Abs 19.4 (*)    Lymphocytes Relative 10 (*)    Monocytes Absolute 1.2 (*)    All other components within normal limits  COMPREHENSIVE METABOLIC PANEL - Abnormal; Notable for the following:    CO2 18 (*)    Glucose, Bld 155 (*)    BUN 37 (*)    Creatinine, Ser 3.44 (*)    Total  Protein 8.6 (*)    GFR calc non Af Amer 18 (*)    GFR calc Af Amer 20 (*)    Anion gap 19 (*)    All other components within normal limits  URINALYSIS, ROUTINE W REFLEX MICROSCOPIC - Abnormal; Notable for the following:    Color, Urine AMBER (*)    APPearance TURBID (*)    Bilirubin Urine MODERATE (*)    Protein, ur 100 (*)    Leukocytes, UA TRACE (*)    All other components within normal limits  URINE MICROSCOPIC-ADD ON - Abnormal; Notable for the following:    Bacteria, UA MANY (*)    Casts HYALINE CASTS (*)    All other components within normal limits  URINE CULTURE  CLOSTRIDIUM DIFFICILE BY PCR  OVA AND PARASITE EXAMINATION  LIPASE, BLOOD  TSH  HEMOGLOBIN A1C  COMPREHENSIVE METABOLIC PANEL  CBC  I-STAT TROPOININ, ED    Imaging Review Dg Chest 2 View  10/23/2013   CLINICAL DATA:  Very and vomiting for 3 days. Initial encounter. History of hypertension.  EXAM: CHEST  2 VIEW  COMPARISON:  Two-view chest 04/13/2013.  FINDINGS: Heart size is normal. The lungs are clear. Emphysematous changes again noted. The visualized soft tissues and bony thorax are unremarkable.  IMPRESSION: No active cardiopulmonary disease.   Electronically Signed   By: Gennette Pachris  Mattern M.D.   On: 10/23/2013 21:53     EKG Interpretation   Date/Time:  Wednesday October 23 2013 21:18:22 EDT Ventricular Rate:  80 PR Interval:  110 QRS Duration: 91 QT Interval:  398 QTC Calculation: 459 R Axis:   62 Text Interpretation:  Sinus rhythm Borderline short PR interval LAE,  consider biatrial enlargement Probable left ventricular hypertrophy  Nonspecific T abnormalities, lateral leads Baseline wander in lead(s) V1  No significant change was found Confirmed by CAMPOS  MD, Caryn BeeKEVIN ([id number]) on  10/23/2013 10:52:30 PM      MDM   Final diagnoses:  AKI (acute kidney injury)  Diarrhea  Essential hypertension    Patient presents emergency department for 3 days of diarrhea. No risk factors for c diff, but culture  was sent. Patient with new acute renal failure. Patient given 2 L of NS in ED. Patient admitted to medicine for further management. Admission is appreciated. Vital signs stable at this time.   Mora BellmanHannah S Laurance Heide, PA-C 10/24/13 0221

## 2013-10-23 NOTE — ED Provider Notes (Signed)
BUN  Date Value Ref Range Status  10/23/2013 37* 6 - 23 mg/dL Final  16/1/096012/03/2010 15  6 - 23 mg/dL Final  4/5/40981/09/2007 14   Final   Creatinine, Ser  Date Value Ref Range Status  10/23/2013 3.44* 0.50 - 1.35 mg/dL Final  11/9/147812/03/2010 2.951.20  0.50 - 1.35 mg/dL Final  6/2/13081/09/2007 6.570.80   Final       Lyanne CoKevin M Briarrose Shor, MD 10/23/13 2313

## 2013-10-23 NOTE — ED Notes (Signed)
Pt presents with family, pt speaks very limited [place]. Per family pt c/o diarrhea x 2-3 days with emesis today. Pt reports heart burn and voice became hoarse today.

## 2013-10-24 LAB — URINE CULTURE
COLONY COUNT: NO GROWTH
Culture: NO GROWTH

## 2013-10-24 LAB — HEMOGLOBIN A1C
HEMOGLOBIN A1C: 6.2 % — AB (ref <5.7)
Mean Plasma Glucose: 131 mg/dL — ABNORMAL HIGH (ref <117)

## 2013-10-24 LAB — COMPREHENSIVE METABOLIC PANEL
ALBUMIN: 4 g/dL (ref 3.5–5.2)
ALK PHOS: 103 U/L (ref 39–117)
ALT: 11 U/L (ref 0–53)
AST: 11 U/L (ref 0–37)
Anion gap: 15 (ref 5–15)
BILIRUBIN TOTAL: 0.4 mg/dL (ref 0.3–1.2)
BUN: 42 mg/dL — ABNORMAL HIGH (ref 6–23)
CHLORIDE: 104 meq/L (ref 96–112)
CO2: 19 meq/L (ref 19–32)
Calcium: 9.2 mg/dL (ref 8.4–10.5)
Creatinine, Ser: 2.64 mg/dL — ABNORMAL HIGH (ref 0.50–1.35)
GFR calc Af Amer: 28 mL/min — ABNORMAL LOW (ref >90)
GFR, EST NON AFRICAN AMERICAN: 24 mL/min — AB (ref >90)
Glucose, Bld: 108 mg/dL — ABNORMAL HIGH (ref 70–99)
POTASSIUM: 3.9 meq/L (ref 3.7–5.3)
SODIUM: 138 meq/L (ref 137–147)
Total Protein: 7.6 g/dL (ref 6.0–8.3)

## 2013-10-24 LAB — URINALYSIS, ROUTINE W REFLEX MICROSCOPIC
Glucose, UA: NEGATIVE mg/dL
HGB URINE DIPSTICK: NEGATIVE
Ketones, ur: NEGATIVE mg/dL
Nitrite: NEGATIVE
PROTEIN: 100 mg/dL — AB
Specific Gravity, Urine: 1.026 (ref 1.005–1.030)
UROBILINOGEN UA: 0.2 mg/dL (ref 0.0–1.0)
pH: 5 (ref 5.0–8.0)

## 2013-10-24 LAB — CBC
HEMATOCRIT: 42 % (ref 39.0–52.0)
Hemoglobin: 13.7 g/dL (ref 13.0–17.0)
MCH: 27.3 pg (ref 26.0–34.0)
MCHC: 32.6 g/dL (ref 30.0–36.0)
MCV: 83.8 fL (ref 78.0–100.0)
PLATELETS: 312 10*3/uL (ref 150–400)
RBC: 5.01 MIL/uL (ref 4.22–5.81)
RDW: 14.7 % (ref 11.5–15.5)
WBC: 14.9 10*3/uL — ABNORMAL HIGH (ref 4.0–10.5)

## 2013-10-24 LAB — URINE MICROSCOPIC-ADD ON

## 2013-10-24 LAB — TSH: TSH: 0.861 u[IU]/mL (ref 0.350–4.500)

## 2013-10-24 MED ORDER — CIPROFLOXACIN HCL 500 MG PO TABS
500.0000 mg | ORAL_TABLET | Freq: Two times a day (BID) | ORAL | Status: DC
  Administered 2013-10-24: 500 mg via ORAL
  Filled 2013-10-24 (×4): qty 1

## 2013-10-24 MED ORDER — ADULT MULTIVITAMIN W/MINERALS CH
1.0000 | ORAL_TABLET | Freq: Every day | ORAL | Status: DC
  Administered 2013-10-24: 1 via ORAL
  Filled 2013-10-24: qty 1

## 2013-10-24 MED ORDER — FOLIC ACID 1 MG PO TABS
1.0000 mg | ORAL_TABLET | Freq: Every day | ORAL | Status: DC
  Administered 2013-10-24: 1 mg via ORAL
  Filled 2013-10-24: qty 1

## 2013-10-24 MED ORDER — ACETAMINOPHEN 650 MG RE SUPP: 650.0000 mg | Freq: Four times a day (QID) | RECTAL | Status: DC | PRN

## 2013-10-24 MED ORDER — ONDANSETRON HCL 4 MG PO TABS: 4.0000 mg | ORAL_TABLET | Freq: Four times a day (QID) | ORAL | Status: DC | PRN

## 2013-10-24 MED ORDER — ENSURE COMPLETE PO LIQD
237.0000 mL | Freq: Two times a day (BID) | ORAL | Status: DC
  Administered 2013-10-24: 237 mL via ORAL

## 2013-10-24 MED ORDER — CLOPIDOGREL BISULFATE 75 MG PO TABS
75.0000 mg | ORAL_TABLET | Freq: Every day | ORAL | Status: DC
  Filled 2013-10-24 (×2): qty 1

## 2013-10-24 MED ORDER — VITAMIN B-1 100 MG PO TABS
100.0000 mg | ORAL_TABLET | Freq: Every day | ORAL | Status: DC
  Administered 2013-10-24: 100 mg via ORAL
  Filled 2013-10-24: qty 1

## 2013-10-24 MED ORDER — ROSUVASTATIN CALCIUM 10 MG PO TABS
10.0000 mg | ORAL_TABLET | Freq: Every day | ORAL | Status: DC
  Filled 2013-10-24 (×2): qty 1

## 2013-10-24 MED ORDER — CIPROFLOXACIN HCL 500 MG PO TABS: 500.0000 mg | ORAL_TABLET | Freq: Two times a day (BID) | ORAL | Status: DC

## 2013-10-24 MED ORDER — LOSARTAN POTASSIUM 50 MG PO TABS
100.0000 mg | ORAL_TABLET | Freq: Every day | ORAL | Status: DC
  Filled 2013-10-24 (×2): qty 2

## 2013-10-24 MED ORDER — HYDROCHLOROTHIAZIDE 25 MG PO TABS
25.0000 mg | ORAL_TABLET | Freq: Every day | ORAL | Status: DC
  Filled 2013-10-24 (×2): qty 1

## 2013-10-24 MED ORDER — SODIUM CHLORIDE 0.9 % IV SOLN
INTRAVENOUS | Status: DC
  Administered 2013-10-24: 01:00:00 via INTRAVENOUS

## 2013-10-24 MED ORDER — HEPARIN SODIUM (PORCINE) 5000 UNIT/ML IJ SOLN
5000.0000 [IU] | Freq: Three times a day (TID) | INTRAMUSCULAR | Status: DC
  Administered 2013-10-24: 5000 [IU] via SUBCUTANEOUS
  Filled 2013-10-24 (×4): qty 1

## 2013-10-24 MED ORDER — OXYCODONE HCL 5 MG PO TABS: 5.0000 mg | ORAL_TABLET | ORAL | Status: DC | PRN

## 2013-10-24 MED ORDER — ACETAMINOPHEN 325 MG PO TABS: 650.0000 mg | ORAL_TABLET | Freq: Four times a day (QID) | ORAL | Status: DC | PRN

## 2013-10-24 MED ORDER — METOPROLOL TARTRATE 12.5 MG HALF TABLET: 50.0000 mg | ORAL_TABLET | Freq: Two times a day (BID) | ORAL | Status: DC

## 2013-10-24 MED ORDER — LOSARTAN POTASSIUM-HCTZ 100-25 MG PO TABS: 1.0000 | ORAL_TABLET | Freq: Every day | ORAL | Status: DC

## 2013-10-24 MED ORDER — METRONIDAZOLE 500 MG PO TABS: 500.0000 mg | ORAL_TABLET | Freq: Three times a day (TID) | ORAL | Status: DC

## 2013-10-24 MED ORDER — AMLODIPINE BESYLATE 10 MG PO TABS
10.0000 mg | ORAL_TABLET | Freq: Every day | ORAL | Status: DC
  Filled 2013-10-24 (×2): qty 1

## 2013-10-24 MED ORDER — ONDANSETRON HCL 4 MG/2ML IJ SOLN: 4.0000 mg | Freq: Four times a day (QID) | INTRAMUSCULAR | Status: DC | PRN

## 2013-10-24 NOTE — Discharge Summary (Signed)
Physician Discharge Summary  Kevin NunnerySharif A Jacko MWN:[id number] DOB: 10/13/1950 DOA: 10/23/2013  PCP: No primary provider on file.  Admit date: 10/23/2013 Discharge date: 10/24/2013  Time spent: *50 minutes  Recommendations for Outpatient Follow-up:  1. *Follow up PCP in one week, get blood work in one week  Discharge Diagnoses:  Active Problems:   AKI (acute kidney injury)   Hypertension   Asthma   Diarrhea   Acute renal failure   Discharge Condition: Stable  Diet recommendation: Regular diet  Filed Weights   10/23/13 2105  Weight: 74.844 kg (165 lb)    History of present illness:  63 y.o. male presents with diarrhea. He states that he was his usual health until the last three days. Patient states he has been having diarrhea which is watery. He states that he has no mucus and no blood noted in the stool. He also has had a loss of appetite. In addition to this he has had some abdominal cramping. Patient has not noted fevers. He denies any sick contacts. He has not traveled abroad. He states that he has only been taking his usual medications for his blood pressure. He has no headaches and no chest pain and no abdominal pain.   Hospital Course:   Acute kidney injury Multifactorial, due to diarrhea, and also medication induced. He was taking Lisinopril/hctz at home. He has received IV fluids in the hospital and the creatinine has improved to 2.64, he does not wantb to wait for another day. Will discharge him home. Will discontinue lisinopril/hctz. He will need repeat bmp in one week.  Hypertension will continue with amlodipine and start metoprolol 12.5 mg po BID  Diarrhea Resolved, no BM since he came to the hospital, likely viral. No sample could be obtained, he was started empirically on cipro and flagyl. Will discharge on Cipro and Flagyl for five more days.  Leukocytosis Improved, wbc today is 14000. Likely due to diarrhea and  dehydration.  Procedures:  None  Consultations:  None  Discharge Exam: Filed Vitals:   10/24/13 0630  BP: 113/63  Pulse: 85  Temp: 98.5 F (36.9 C)  Resp: 18    General: Appear in no acute distress Cardiovascular: S1s2 RRR Respiratory: Clear bilaterally  Discharge Instructions You were cared for by a hospitalist during your hospital stay. If you have any questions about your discharge medications or the care you received while you were in the hospital after you are discharged, you can call the unit and asked to speak with the hospitalist on call if the hospitalist that took care of you is not available. Once you are discharged, your primary care physician will handle any further medical issues. Please note that NO REFILLS for any discharge medications will be authorized once you are discharged, as it is imperative that you return to your primary care physician (or establish a relationship with a primary care physician if you do not have one) for your aftercare needs so that they can reassess your need for medications and monitor your lab values.   Current Discharge Medication List    START taking these medications   Details  ciprofloxacin (CIPRO) 500 MG tablet Take 1 tablet (500 mg total) by mouth 2 (two) times daily. Qty: 10 tablet, Refills: 0    metoprolol tartrate (LOPRESSOR) 12.5 mg TABS tablet Take 2 tablets (50 mg total) by mouth 2 (two) times daily. Qty: 60 tablet, Refills: 2    metroNIDAZOLE (FLAGYL) 500 MG tablet Take 1 tablet (500 mg total)  by mouth 3 (three) times daily. Qty: 15 tablet, Refills: 0      CONTINUE these medications which have NOT CHANGED   Details  amLODipine (NORVASC) 10 MG tablet Take 10 mg by mouth at bedtime.      clopidogrel (PLAVIX) 75 MG tablet Take 75 mg by mouth at bedtime.      rosuvastatin (CRESTOR) 10 MG tablet Take 10 mg by mouth at bedtime.        STOP taking these medications     losartan-hydrochlorothiazide (HYZAAR) 100-25  MG per tablet        No Known Allergies    The results of significant diagnostics from this hospitalization (including imaging, microbiology, ancillary and laboratory) are listed below for reference.    Significant Diagnostic Studies: Dg Chest 2 View  10/23/2013   CLINICAL DATA:  Very and vomiting for 3 days. Initial encounter. History of hypertension.  EXAM: CHEST  2 VIEW  COMPARISON:  Two-view chest 04/13/2013.  FINDINGS: Heart size is normal. The lungs are clear. Emphysematous changes again noted. The visualized soft tissues and bony thorax are unremarkable.  IMPRESSION: No active cardiopulmonary disease.   Electronically Signed   By: Gennette Pac M.D.   On: 10/23/2013 21:53    Microbiology: No results found for this or any previous visit (from the past 240 hour(s)).   Labs: Basic Metabolic Panel:  Recent Labs Lab 10/23/13 2120 10/24/13 0423  NA 139 138  K 4.2 3.9  CL 102 104  CO2 18* 19  GLUCOSE 155* 108*  BUN 37* 42*  CREATININE 3.44* 2.64*  CALCIUM 9.9 9.2   Liver Function Tests:  Recent Labs Lab 10/23/13 2120 10/24/13 0423  AST 13 11  ALT 13 11  ALKPHOS 113 103  BILITOT 0.4 0.4  PROT 8.6* 7.6  ALBUMIN 4.6 4.0    Recent Labs Lab 10/23/13 2120  LIPASE 37   No results found for this basename: AMMONIA,  in the last 168 hours CBC:  Recent Labs Lab 10/23/13 2120 10/24/13 0423  WBC 23.3* 14.9*  NEUTROABS 19.4*  --   HGB 15.2 13.7  HCT 45.9 42.0  MCV 85.5 83.8  PLT 326 312   Cardiac Enzymes: No results found for this basename: CKTOTAL, CKMB, CKMBINDEX, TROPONINI,  in the last 168 hours BNP: BNP (last 3 results) No results found for this basename: PROBNP,  in the last 8760 hours CBG: No results found for this basename: GLUCAP,  in the last 168 hours     Signed:  Lindalee Huizinga S  [facility] 10/24/2013, 4:24 PM

## 2013-10-24 NOTE — Progress Notes (Signed)
INITIAL NUTRITION ASSESSMENT  DOCUMENTATION CODES Per approved criteria  -Not Applicable   INTERVENTION: - Ensure Complete BID - Encouraged continued excellent meal intake - RD to continue to monitor   NUTRITION DIAGNOSIS: Unintended weight loss related to diarrhea and poor appetite as evidenced by wife's report.    Goal: Pt to consume >90% of meals/supplements  Monitor:  Weights, labs, intake  Reason for Assessment: Malnutrition screening tool   63 y.o. male  Admitting Dx: Diarrhea  ASSESSMENT: Admitted with 3 days of watery diarrhea, found to have acute renal failure.   - Met with pt and wife who report pt couldn't eat much of anything since Monday however before then had a good appetite and ate 3 meals/day that were well balanced - Denies any diarrhea today and reports he ate 100% of his breakfast - Wife suspects pt may have lost a few pounds since he's been sick    Height: Ht Readings from Last 1 Encounters:  10/23/13 $RemoveB'5\' 7"'HVYtORRs$  (1.702 m)    Weight: Wt Readings from Last 1 Encounters:  10/23/13 165 lb (74.844 kg)    Ideal Body Weight: 148 lbs   % Ideal Body Weight: 111%  Wt Readings from Last 10 Encounters:  10/23/13 165 lb (74.844 kg)    Usual Body Weight: Pt unsure   BMI:  Body mass index is 25.84 kg/(m^2).  Estimated Nutritional Needs: Kcal: 1600-1800 Protein: 75-90g Fluid: 1.6-1.8L/day   Skin: intact   Diet Order: Cardiac  EDUCATION NEEDS: -No education needs identified at this time   Intake/Output Summary (Last 24 hours) at 10/24/13 1115 Last data filed at 10/24/13 1000  Gross per 24 hour  Intake 1376.25 ml  Output      0 ml  Net 1376.25 ml    Last BM: 10/7  Labs:   Recent Labs Lab 10/23/13 2120 10/24/13 0423  NA 139 138  K 4.2 3.9  CL 102 104  CO2 18* 19  BUN 37* 42*  CREATININE 3.44* 2.64*  CALCIUM 9.9 9.2  GLUCOSE 155* 108*    CBG (last 3)  No results found for this basename: GLUCAP,  in the last 72  hours  Scheduled Meds: . amLODipine  10 mg Oral QHS  . ciprofloxacin  500 mg Oral BID  . clopidogrel  75 mg Oral QHS  . folic acid  1 mg Oral Daily  . heparin  5,000 Units Subcutaneous 3 times per day  . losartan  100 mg Oral QHS   And  . hydrochlorothiazide  25 mg Oral QHS  . multivitamin with minerals  1 tablet Oral Daily  . rosuvastatin  10 mg Oral QHS  . thiamine  100 mg Oral Daily    Continuous Infusions: . sodium chloride 75 mL/hr at 10/24/13 0115    Past Medical History  Diagnosis Date  . Asthma   . Hypertension     Past Surgical History  Procedure Laterality Date  . Cardiac catheterization      10 years ago     Carlis Stable MS, [place], [place] Pager [phone] Weekend/After Hours Pager

## 2013-10-26 NOTE — ED Provider Notes (Signed)
Medical screening examination/treatment/procedure(s) were performed by non-physician practitioner and as supervising physician I was immediately available for consultation/collaboration.   EKG Interpretation   Date/Time:  Wednesday October 23 2013 21:18:22 EDT Ventricular Rate:  80 PR Interval:  110 QRS Duration: 91 QT Interval:  398 QTC Calculation: 459 R Axis:   62 Text Interpretation:  Sinus rhythm Borderline short PR interval LAE,  consider biatrial enlargement Probable left ventricular hypertrophy  Nonspecific T abnormalities, lateral leads Baseline wander in lead(s) V1  No significant change was found Confirmed by Hopie Pellegrin  MD, Manjot Beumer ([id number]) on  10/23/2013 10:52:30 PM        Lyanne CoKevin M Oakley Kossman, MD 10/26/13 [phone]

## 2014-02-01 ENCOUNTER — Emergency Department (HOSPITAL_COMMUNITY)
Admission: EM | Admit: 2014-02-01 | Discharge: 2014-02-01 | Disposition: A | Discharge status: Home / Self Care | Payer: Medicare Other | Attending: Emergency Medicine | Admitting: Emergency Medicine

## 2014-02-01 ENCOUNTER — Emergency Department (HOSPITAL_COMMUNITY): Payer: Medicare Other

## 2014-02-01 ENCOUNTER — Encounter (HOSPITAL_COMMUNITY): Payer: Self-pay | Admitting: Emergency Medicine

## 2014-02-01 DIAGNOSIS — S80812A Abrasion, left lower leg, initial encounter: Secondary | ICD-10-CM | POA: Diagnosis not present

## 2014-02-01 DIAGNOSIS — Y998 Other external cause status: Secondary | ICD-10-CM | POA: Insufficient documentation

## 2014-02-01 DIAGNOSIS — R9082 White matter disease, unspecified: Secondary | ICD-10-CM | POA: Diagnosis not present

## 2014-02-01 DIAGNOSIS — S80811A Abrasion, right lower leg, initial encounter: Secondary | ICD-10-CM | POA: Diagnosis not present

## 2014-02-01 DIAGNOSIS — E785 Hyperlipidemia, unspecified: Secondary | ICD-10-CM | POA: Insufficient documentation

## 2014-02-01 DIAGNOSIS — S199XXA Unspecified injury of neck, initial encounter: Secondary | ICD-10-CM | POA: Insufficient documentation

## 2014-02-01 DIAGNOSIS — Z792 Long term (current) use of antibiotics: Secondary | ICD-10-CM | POA: Insufficient documentation

## 2014-02-01 DIAGNOSIS — Y9389 Activity, other specified: Secondary | ICD-10-CM | POA: Diagnosis not present

## 2014-02-01 DIAGNOSIS — I10 Essential (primary) hypertension: Secondary | ICD-10-CM | POA: Insufficient documentation

## 2014-02-01 DIAGNOSIS — Y92410 Unspecified street and highway as the place of occurrence of the external cause: Secondary | ICD-10-CM | POA: Insufficient documentation

## 2014-02-01 DIAGNOSIS — J45909 Unspecified asthma, uncomplicated: Secondary | ICD-10-CM | POA: Diagnosis not present

## 2014-02-01 DIAGNOSIS — M62838 Other muscle spasm: Secondary | ICD-10-CM

## 2014-02-01 DIAGNOSIS — Z9889 Other specified postprocedural states: Secondary | ICD-10-CM | POA: Insufficient documentation

## 2014-02-01 DIAGNOSIS — Z7902 Long term (current) use of antithrombotics/antiplatelets: Secondary | ICD-10-CM | POA: Insufficient documentation

## 2014-02-01 DIAGNOSIS — Z87891 Personal history of nicotine dependence: Secondary | ICD-10-CM | POA: Insufficient documentation

## 2014-02-01 DIAGNOSIS — S0083XA Contusion of other part of head, initial encounter: Secondary | ICD-10-CM | POA: Insufficient documentation

## 2014-02-01 DIAGNOSIS — Z79899 Other long term (current) drug therapy: Secondary | ICD-10-CM | POA: Insufficient documentation

## 2014-02-01 HISTORY — DX: Hyperlipidemia, unspecified: E78.5

## 2014-02-01 MED ORDER — ONDANSETRON 4 MG PO TBDP
4.0000 mg | ORAL_TABLET | Freq: Once | ORAL | Status: AC
  Administered 2014-02-01: 4 mg via ORAL
  Filled 2014-02-01: qty 1

## 2014-02-01 MED ORDER — HYDROCODONE-ACETAMINOPHEN 5-325 MG PO TABS
1.0000 | ORAL_TABLET | Freq: Once | ORAL | Status: AC
  Administered 2014-02-01: 1 via ORAL
  Filled 2014-02-01: qty 1

## 2014-02-01 MED ORDER — CLONIDINE HCL 0.1 MG PO TABS
0.1000 mg | ORAL_TABLET | Freq: Once | ORAL | Status: AC
Start: 2014-02-01 — End: 2014-02-01
  Administered 2014-02-01: 0.1 mg via ORAL
  Filled 2014-02-01: qty 1

## 2014-02-01 MED ORDER — HYDROCODONE-ACETAMINOPHEN 5-325 MG PO TABS: 1.0000 | ORAL_TABLET | ORAL | Status: DC | PRN

## 2014-02-01 MED ORDER — IBUPROFEN 600 MG PO TABS: 600.0000 mg | ORAL_TABLET | Freq: Four times a day (QID) | ORAL | Status: DC | PRN

## 2014-02-01 NOTE — ED Notes (Signed)
Discharge delayed due to elevated BP. Pt stated that he took his medications this am
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
## 2014-05-10 ENCOUNTER — Emergency Department (HOSPITAL_COMMUNITY): Payer: Medicare Other

## 2014-05-10 ENCOUNTER — Emergency Department (HOSPITAL_COMMUNITY)
Admission: EM | Admit: 2014-05-10 | Discharge: 2014-05-10 | Disposition: A | Discharge status: Home / Self Care | Payer: Medicare Other | Attending: Emergency Medicine | Admitting: Emergency Medicine

## 2014-05-10 ENCOUNTER — Encounter (HOSPITAL_COMMUNITY): Payer: Self-pay | Admitting: Emergency Medicine

## 2014-05-10 DIAGNOSIS — I10 Essential (primary) hypertension: Secondary | ICD-10-CM | POA: Insufficient documentation

## 2014-05-10 DIAGNOSIS — E785 Hyperlipidemia, unspecified: Secondary | ICD-10-CM | POA: Insufficient documentation

## 2014-05-10 DIAGNOSIS — J45901 Unspecified asthma with (acute) exacerbation: Secondary | ICD-10-CM | POA: Insufficient documentation

## 2014-05-10 DIAGNOSIS — R05 Cough: Secondary | ICD-10-CM | POA: Diagnosis present

## 2014-05-10 DIAGNOSIS — Z79899 Other long term (current) drug therapy: Secondary | ICD-10-CM | POA: Diagnosis not present

## 2014-05-10 DIAGNOSIS — Z87891 Personal history of nicotine dependence: Secondary | ICD-10-CM | POA: Diagnosis not present

## 2014-05-10 DIAGNOSIS — J40 Bronchitis, not specified as acute or chronic: Secondary | ICD-10-CM

## 2014-05-10 LAB — CBC WITH DIFFERENTIAL/PLATELET
BASOS ABS: 0.1 10*3/uL (ref 0.0–0.1)
BASOS PCT: 1 % (ref 0–1)
EOS ABS: 0.4 10*3/uL (ref 0.0–0.7)
EOS PCT: 3 % (ref 0–5)
HCT: 38.1 % — ABNORMAL LOW (ref 39.0–52.0)
Hemoglobin: 12.2 g/dL — ABNORMAL LOW (ref 13.0–17.0)
Lymphocytes Relative: 26 % (ref 12–46)
Lymphs Abs: 3.1 10*3/uL (ref 0.7–4.0)
MCH: 26.3 pg (ref 26.0–34.0)
MCHC: 32 g/dL (ref 30.0–36.0)
MCV: 82.3 fL (ref 78.0–100.0)
MONO ABS: 1.1 10*3/uL — AB (ref 0.1–1.0)
Monocytes Relative: 9 % (ref 3–12)
NEUTROS ABS: 7.4 10*3/uL (ref 1.7–7.7)
NEUTROS PCT: 61 % (ref 43–77)
PLATELETS: 283 10*3/uL (ref 150–400)
RBC: 4.63 MIL/uL (ref 4.22–5.81)
RDW: 15.1 % (ref 11.5–15.5)
WBC: 12.1 10*3/uL — ABNORMAL HIGH (ref 4.0–10.5)

## 2014-05-10 LAB — COMPREHENSIVE METABOLIC PANEL
ALBUMIN: 3.4 g/dL — AB (ref 3.5–5.2)
ALT: 16 U/L (ref 0–53)
ANION GAP: 8 (ref 5–15)
AST: 15 U/L (ref 0–37)
Alkaline Phosphatase: 97 U/L (ref 39–117)
BUN: 24 mg/dL — ABNORMAL HIGH (ref 6–23)
CO2: 25 mmol/L (ref 19–32)
Calcium: 9.2 mg/dL (ref 8.4–10.5)
Chloride: 109 mmol/L (ref 96–112)
Creatinine, Ser: 1.13 mg/dL (ref 0.50–1.35)
GFR calc Af Amer: 78 mL/min — ABNORMAL LOW (ref >90)
GFR calc non Af Amer: 67 mL/min — ABNORMAL LOW (ref >90)
Glucose, Bld: 112 mg/dL — ABNORMAL HIGH (ref 70–99)
Potassium: 3.8 mmol/L (ref 3.5–5.1)
SODIUM: 142 mmol/L (ref 135–145)
TOTAL PROTEIN: 6.3 g/dL (ref 6.0–8.3)
Total Bilirubin: 0.4 mg/dL (ref 0.3–1.2)

## 2014-05-10 MED ORDER — AZITHROMYCIN 250 MG PO TABS: ORAL_TABLET | ORAL | Status: DC

## 2014-05-10 MED ORDER — AZITHROMYCIN 250 MG PO TABS
500.0000 mg | ORAL_TABLET | Freq: Once | ORAL | Status: AC
  Administered 2014-05-10: 500 mg via ORAL
  Filled 2014-05-10: qty 2

## 2014-05-10 NOTE — ED Notes (Signed)
Pt reports dry cough x 1 week; pt reports difficulty sleeping with cough; pt reports sob associated with cough; pt reports nonproductive;

## 2014-05-10 NOTE — Discharge Instructions (Signed)
Follow up next week if not improving. °

## 2014-05-10 NOTE — ED Provider Notes (Signed)
CSN: [id number]     Arrival date & time 05/10/14  1920 History   First MD Initiated Contact with Patient 05/10/14 2041     Chief Complaint  Patient presents with  . Cough  . Shortness of Breath     (Consider location/radiation/quality/duration/timing/severity/associated sxs/prior Treatment) Patient is a 64 y.o. male presenting with cough and shortness of breath. The history is provided by the patient (the pt complains of a cough for a few days).  Cough Cough characteristics:  Productive Sputum characteristics:  Green Severity:  Moderate Onset quality:  Gradual Timing:  Constant Chronicity:  New Smoker: no   Context: not animal exposure   Associated symptoms: shortness of breath   Associated symptoms: no chest pain, no eye discharge, no headaches and no rash   Shortness of Breath Associated symptoms: cough   Associated symptoms: no abdominal pain, no chest pain, no headaches and no rash     Past Medical History  Diagnosis Date  . Asthma   . Hypertension   . Hyperlipidemia    Past Surgical History  Procedure Laterality Date  . Cardiac catheterization      10 years ago  . Elbow surgery     Family History  Problem Relation Age of Onset  . Hypertension Mother    History  Substance Use Topics  . Smoking status: Former Games developermoker  . Smokeless tobacco: Never Used  . Alcohol Use: No    Review of Systems  Constitutional: Negative for appetite change and fatigue.  HENT: Negative for congestion, ear discharge and sinus pressure.   Eyes: Negative for discharge.  Respiratory: Positive for cough and shortness of breath.   Cardiovascular: Negative for chest pain.  Gastrointestinal: Negative for abdominal pain and diarrhea.  Genitourinary: Negative for frequency and hematuria.  Musculoskeletal: Negative for back pain.  Skin: Negative for rash.  Neurological: Negative for seizures and headaches.  Psychiatric/Behavioral: Negative for hallucinations.      Allergies   Pork-derived products  Home Medications   Prior to Admission medications   Medication Sig Start Date End Date Taking? Authorizing Provider  albuterol (PROVENTIL HFA;VENTOLIN HFA) 108 (90 BASE) MCG/ACT inhaler Inhale 2 puffs into the lungs every 6 (six) hours as needed for wheezing or shortness of breath.   Yes Historical Provider, MD  amLODipine (NORVASC) 10 MG tablet Take 10 mg by mouth at bedtime.     Yes Historical Provider, MD  clopidogrel (PLAVIX) 75 MG tablet Take 75 mg by mouth at bedtime.     Yes Historical Provider, MD  metoprolol tartrate (LOPRESSOR) 12.5 mg TABS tablet Take 2 tablets (50 mg total) by mouth 2 (two) times daily. Patient taking differently: Take 12.5 mg by mouth 2 (two) times daily.  10/24/13  Yes Meredeth IdeGagan S Lama, MD  rosuvastatin (CRESTOR) 10 MG tablet Take 10 mg by mouth at bedtime.     Yes Historical Provider, MD  azithromycin (ZITHROMAX) 250 MG tablet Take one pill a day.  Start on 4/24 05/10/14   Bethann BerkshireJoseph Khairi Garman, MD  ciprofloxacin (CIPRO) 500 MG tablet Take 1 tablet (500 mg total) by mouth 2 (two) times daily. 10/24/13   Meredeth IdeGagan S Lama, MD  HYDROcodone-acetaminophen (NORCO/VICODIN) 5-325 MG per tablet Take 1 tablet by mouth every 4 (four) hours as needed. 02/01/14   Tiffany Neva SeatGreene, PA-C  ibuprofen (ADVIL,MOTRIN) 600 MG tablet Take 1 tablet (600 mg total) by mouth every 6 (six) hours as needed. 02/01/14   Tiffany Neva SeatGreene, PA-C  metroNIDAZOLE (FLAGYL) 500 MG tablet Take 1 tablet (  500 mg total) by mouth 3 (three) times daily. 10/24/13   Meredeth Ide, MD   BP 170/76 mmHg  Pulse 78  Temp(Src) 98.4 F (36.9 C) (Oral)  Resp 19  Ht  (1.702 m)  Wt 180 lb (81.647 kg)  BMI 28.19 kg/m2  SpO2 93% Physical Exam  Constitutional: He is oriented to person, place, and time. He appears well-developed.  HENT:  Head: Normocephalic.  Eyes: Conjunctivae and EOM are normal. No scleral icterus.  Neck: Neck supple. No thyromegaly present.  Cardiovascular: Normal rate and regular rhythm.   Exam reveals no gallop and no friction rub.   No murmur heard. Pulmonary/Chest: No stridor. He has no wheezes. He has no rales. He exhibits no tenderness.  Abdominal: He exhibits no distension. There is no tenderness. There is no rebound.  Musculoskeletal: Normal range of motion. He exhibits no edema.  Lymphadenopathy:    He has no cervical adenopathy.  Neurological: He is oriented to person, place, and time. He exhibits normal muscle tone. Coordination normal.  Skin: No rash noted. No erythema.  Psychiatric: He has a normal mood and affect. His behavior is normal.    ED Course  Procedures (including critical care time) Labs Review Labs Reviewed  CBC WITH DIFFERENTIAL/PLATELET - Abnormal; Notable for the following:    WBC 12.1 (*)    Hemoglobin 12.2 (*)    HCT 38.1 (*)    Monocytes Absolute 1.1 (*)    All other components within normal limits  COMPREHENSIVE METABOLIC PANEL - Abnormal; Notable for the following:    Glucose, Bld 112 (*)    BUN 24 (*)    Albumin 3.4 (*)    GFR calc non Af Amer 67 (*)    GFR calc Af Amer 78 (*)    All other components within normal limits    Imaging Review Dg Chest 2 View  05/10/2014   CLINICAL DATA:  Productive cough and short of breath  EXAM: CHEST  2 VIEW  COMPARISON:  02/01/2014  FINDINGS: The heart size and mediastinal contours are within normal limits. Both lungs are clear. The visualized skeletal structures are unremarkable.  IMPRESSION: No active cardiopulmonary disease.   Electronically Signed   By: Marlan Palau M.D.   On: 05/10/2014 20:35     EKG Interpretation None      MDM   Final diagnoses:  Bronchitis    tx with zpak and follow up if not improving.    Bethann Berkshire, MD 05/10/14 [phone]

## 2014-05-10 NOTE — ED Notes (Signed)
Pt stable, ambulatory, denies any pain, states understanding of discharge instructions 

## 2015-08-22 ENCOUNTER — Emergency Department (HOSPITAL_COMMUNITY): Payer: Medicare Other

## 2015-08-22 ENCOUNTER — Encounter (HOSPITAL_COMMUNITY): Payer: Self-pay

## 2015-08-22 ENCOUNTER — Emergency Department (HOSPITAL_COMMUNITY)
Admission: EM | Admit: 2015-08-22 | Discharge: 2015-08-22 | Disposition: A | Discharge status: Home / Self Care | Payer: Medicare Other | Attending: Emergency Medicine | Admitting: Emergency Medicine

## 2015-08-22 DIAGNOSIS — Y939 Activity, unspecified: Secondary | ICD-10-CM | POA: Insufficient documentation

## 2015-08-22 DIAGNOSIS — W19XXXA Unspecified fall, initial encounter: Secondary | ICD-10-CM

## 2015-08-22 DIAGNOSIS — Z79899 Other long term (current) drug therapy: Secondary | ICD-10-CM | POA: Insufficient documentation

## 2015-08-22 DIAGNOSIS — Z87891 Personal history of nicotine dependence: Secondary | ICD-10-CM | POA: Diagnosis not present

## 2015-08-22 DIAGNOSIS — S8002XA Contusion of left knee, initial encounter: Secondary | ICD-10-CM | POA: Diagnosis not present

## 2015-08-22 DIAGNOSIS — I10 Essential (primary) hypertension: Secondary | ICD-10-CM | POA: Diagnosis not present

## 2015-08-22 DIAGNOSIS — Y92009 Unspecified place in unspecified non-institutional (private) residence as the place of occurrence of the external cause: Secondary | ICD-10-CM | POA: Diagnosis not present

## 2015-08-22 DIAGNOSIS — Y999 Unspecified external cause status: Secondary | ICD-10-CM | POA: Diagnosis not present

## 2015-08-22 DIAGNOSIS — W010XXA Fall on same level from slipping, tripping and stumbling without subsequent striking against object, initial encounter: Secondary | ICD-10-CM | POA: Diagnosis not present

## 2015-08-22 DIAGNOSIS — J45909 Unspecified asthma, uncomplicated: Secondary | ICD-10-CM | POA: Insufficient documentation

## 2015-08-22 DIAGNOSIS — S8992XA Unspecified injury of left lower leg, initial encounter: Secondary | ICD-10-CM | POA: Diagnosis present

## 2015-08-22 MED ORDER — HYDROCODONE-ACETAMINOPHEN 5-325 MG PO TABS
1.0000 | ORAL_TABLET | Freq: Once | ORAL | Status: AC
  Administered 2015-08-22: 1 via ORAL
  Filled 2015-08-22: qty 1

## 2015-08-22 MED ORDER — IBUPROFEN 400 MG PO TABS
400.0000 mg | ORAL_TABLET | Freq: Once | ORAL | Status: AC
  Administered 2015-08-22: 400 mg via ORAL
  Filled 2015-08-22: qty 1

## 2015-08-22 MED ORDER — HYDROCODONE-ACETAMINOPHEN 5-325 MG PO TABS: ORAL_TABLET | ORAL | 0 refills | Status: AC

## 2015-08-22 NOTE — ED Provider Notes (Signed)
[facility]-EMERGENCY DEPT Provider Note   CSN: [id number] Arrival date & time: 08/22/15  0605  First Provider Contact:  First MD Initiated Contact with Patient 08/22/15 [phone]        History   Chief Complaint Chief Complaint  Patient presents with  . Fall    HPI Kevin Robinson is a 65 y.o. male.  HPI   Blood pressure 160/78, pulse 111, temperature 99 F (37.2 C), resp. rate 16, weight 74.8 kg, SpO2 98 %.  Kevin Robinson is a 65 y.o. male complaining of left knee pain status post mechanical fall 2 days ago with no other trauma. He's been taking ibuprofen at home with little relief. Patient has been into the tori and actually walks this morning to the emergency department. No anticoagulation, patient cannot quantify or describe the quality of the pain. No exacerbating or alleviating factors identified. 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  Past Medical History:  Diagnosis Date  . Asthma   . Hyperlipidemia   . Hypertension     Patient Active Problem List   Diagnosis Date Noted  . AKI (acute kidney injury) (HCC) 10/23/2013  . Hypertension 10/23/2013  . Asthma 10/23/2013  . Diarrhea 10/23/2013  . Acute renal failure (HCC) 10/23/2013    Past Surgical History:  Procedure Laterality Date  . CARDIAC CATHETERIZATION     10 years ago  . ELBOW SURGERY         Home Medications    Prior to Admission medications   Medication Sig Start Date End Date Taking? Authorizing Provider  albuterol (PROVENTIL HFA;VENTOLIN HFA) 108 (90 BASE) MCG/ACT inhaler Inhale 2 puffs into the lungs every 6 (six) hours as needed for wheezing or shortness of breath.    Historical Provider, MD  amLODipine (NORVASC) 10 MG tablet Take 10 mg by mouth at bedtime.      Historical Provider, MD  azithromycin (ZITHROMAX) 250 MG tablet Take one pill a day.  Start on 4/24 05/10/14   Bethann Berkshire, MD  ciprofloxacin (CIPRO) 500 MG tablet Take 1 tablet (500 mg total) by mouth 2 (two)  times daily. 10/24/13   Meredeth Ide, MD  clopidogrel (PLAVIX) 75 MG tablet Take 75 mg by mouth at bedtime.      Historical Provider, MD  HYDROcodone-acetaminophen (NORCO/VICODIN) 5-325 MG per tablet Take 1 tablet by mouth every 4 (four) hours as needed. 02/01/14   Tiffany Neva Seat, PA-C  ibuprofen (ADVIL,MOTRIN) 600 MG tablet Take 1 tablet (600 mg total) by mouth every 6 (six) hours as needed. 02/01/14   Tiffany Neva Seat, PA-C  metoprolol tartrate (LOPRESSOR) 12.5 mg TABS tablet Take 2 tablets (50 mg total) by mouth 2 (two) times daily. Patient taking differently: Take 12.5 mg by mouth 2 (two) times daily.  10/24/13   Meredeth Ide, MD  metroNIDAZOLE (FLAGYL) 500 MG tablet Take 1 tablet (500 mg total) by mouth 3 (three) times daily. 10/24/13   Meredeth Ide, MD  rosuvastatin (CRESTOR) 10 MG tablet Take 10 mg by mouth at bedtime.      Historical Provider, MD    Family History Family History  Problem Relation Age of Onset  . Hypertension Mother     Social History Social History  Substance Use Topics  . Smoking status: Former Games developer  . Smokeless tobacco: Never Used  . Alcohol use No     Allergies   Pork-derived products   Review of Systems Review of Systems 10 systems reviewed and found to be  negative, except as noted in the HPI.   Physical Exam Updated Vital Signs BP 160/78   Pulse 111   Temp 99 F (37.2 C)   Resp 16   Wt 74.8 kg   SpO2 98%   BMI 25.84 kg/m   Physical Exam  Constitutional: He is oriented to person, place, and time. He appears well-developed and well-nourished. No distress.  HENT:  Head: Normocephalic.  Eyes: Conjunctivae and EOM are normal.  Cardiovascular: Normal rate.   tachycardiac  Pulmonary/Chest: Effort normal. No stridor.  Musculoskeletal: Normal range of motion.  Left knee with ecchymosis, no effusion, full active range of motion, distally neurovascularly intact, no crepitance or abnormal laxity on anterior posterior drawer, no focal tenderness  along the joint lines, no abnormal laxity on valgus or varus stress.  Neurological: He is alert and oriented to person, place, and time.  Psychiatric: He has a normal mood and affect.  Nursing note and vitals reviewed.    ED Treatments / Results  Labs (all labs ordered are listed, but only abnormal results are displayed) Labs Reviewed - No data to display  EKG  EKG Interpretation None       Radiology Dg Knee Complete 4 Views Left  Result Date: 08/22/2015 CLINICAL DATA:  Fall. EXAM: LEFT KNEE - COMPLETE 4+ VIEW COMPARISON:  02/01/2014 FINDINGS: There is a small suprapatellar joint effusion. There are 2 loose bodies identified within the suprapatellar joint space. These may reflect fractures of moderate medial compartment narrowing, sharpening of the tibial spines and marginal spur formation are identified. IMPRESSION: 1. Suprapatellar joint effusion 2. Two loose bodies. I suspect these represent fractured osteophytes from the proximal pole of the patella. 3. Osteoarthritis. Electronically Signed   By: Signa Kell M.D.   On: 08/22/2015 07:42    Procedures Procedures (including critical care time)  Medications Ordered in ED Medications - No data to display   Initial Impression / Assessment and Plan / ED Course  I have reviewed the triage vital signs and the nursing notes.  Pertinent labs & imaging results that were available during my care of the patient were reviewed by me and considered in my medical decision making (see chart for details).  Clinical Course    Vitals:   08/22/15 0630 08/22/15 0700 08/22/15 0729 08/22/15 0745  BP: 160/78 157/75 190/84 162/79  Pulse: 111 108 99 99  Resp:   14   Temp:      SpO2: 98% 97% 99% 97%  Weight:        Medications  HYDROcodone-acetaminophen (NORCO/VICODIN) 5-325 MG per tablet 1 tablet (1 tablet Oral Given 08/22/15 0705)  ibuprofen (ADVIL,MOTRIN) tablet 400 mg (400 mg Oral Given 08/22/15 0705)    Kevin Robinson is 65 y.o. male  presenting with Left knee pain status post mechanical fall 2 days ago, patient is ambulatory at his baseline. He walks a hospitalist morning. He has a contusion to the knee but has full range of motion with no abnormal laxity. X-rays negative, patient given Ace wrap, crutches, Vicodin and orthopedic referral.  Evaluation does not show pathology that would require ongoing emergent intervention or inpatient treatment. Pt is hemodynamically stable and mentating appropriately. Discussed findings and plan with patient/guardian, who agrees with care plan. All questions answered. Return precautions discussed and outpatient follow up given.   New Prescriptions   HYDROCODONE-ACETAMINOPHEN (NORCO/VICODIN) 5-325 MG TABLET    Take 1-2 tablets by mouth every 6 hours as needed for pain and/or cough.  Final Clinical Impressions(s) / ED Diagnoses   Final diagnoses:  Knee contusion, left, initial encounter  Fall at home, initial encounter    New Prescriptions New Prescriptions   HYDROCODONE-ACETAMINOPHEN (NORCO/VICODIN) 5-325 MG TABLET    Take 1-2 tablets by mouth every 6 hours as needed for pain and/or cough.     Joni Reining Lynnox Girten, PA-C 08/22/15 4098    Vanetta Mulders, MD 08/23/15 [phone]

## 2015-08-22 NOTE — ED Triage Notes (Signed)
Pt tripped and fell on his left knee. Pt complaining of pain 6/10. Obvious brusing no other damage.

## 2015-08-22 NOTE — Discharge Instructions (Signed)
Take vicodin for breakthrough pain, do not drink alcohol, drive, care for children or do other critical tasks while taking vicodin. ° °Please follow with your primary care doctor in the next 2 days for a check-up. They must obtain records for further management.  ° °Do not hesitate to return to the Emergency Department for any new, worsening or concerning symptoms.  ° °

## 2015-12-13 ENCOUNTER — Emergency Department (HOSPITAL_COMMUNITY): Payer: Medicare Other

## 2015-12-13 ENCOUNTER — Encounter (HOSPITAL_COMMUNITY): Payer: Self-pay

## 2015-12-13 ENCOUNTER — Inpatient Hospital Stay (HOSPITAL_COMMUNITY)
Admission: EM | Admit: 2015-12-13 | Discharge: 2015-12-15 | DRG: 304 | Disposition: A | Discharge status: Home / Self Care | Payer: Medicare Other | Attending: Internal Medicine | Admitting: Internal Medicine

## 2015-12-13 DIAGNOSIS — J810 Acute pulmonary edema: Secondary | ICD-10-CM

## 2015-12-13 DIAGNOSIS — R7989 Other specified abnormal findings of blood chemistry: Secondary | ICD-10-CM

## 2015-12-13 DIAGNOSIS — J811 Chronic pulmonary edema: Secondary | ICD-10-CM | POA: Diagnosis present

## 2015-12-13 DIAGNOSIS — E785 Hyperlipidemia, unspecified: Secondary | ICD-10-CM | POA: Diagnosis present

## 2015-12-13 DIAGNOSIS — N189 Chronic kidney disease, unspecified: Secondary | ICD-10-CM | POA: Diagnosis present

## 2015-12-13 DIAGNOSIS — J440 Chronic obstructive pulmonary disease with acute lower respiratory infection: Secondary | ICD-10-CM | POA: Diagnosis present

## 2015-12-13 DIAGNOSIS — J189 Pneumonia, unspecified organism: Secondary | ICD-10-CM | POA: Diagnosis present

## 2015-12-13 DIAGNOSIS — N183 Chronic kidney disease, stage 3 unspecified: Secondary | ICD-10-CM

## 2015-12-13 DIAGNOSIS — I4581 Long QT syndrome: Secondary | ICD-10-CM | POA: Diagnosis present

## 2015-12-13 DIAGNOSIS — J45901 Unspecified asthma with (acute) exacerbation: Secondary | ICD-10-CM | POA: Diagnosis present

## 2015-12-13 DIAGNOSIS — I509 Heart failure, unspecified: Secondary | ICD-10-CM | POA: Diagnosis present

## 2015-12-13 DIAGNOSIS — Z9114 Patient's other noncompliance with medication regimen: Secondary | ICD-10-CM

## 2015-12-13 DIAGNOSIS — I161 Hypertensive emergency: Principal | ICD-10-CM | POA: Diagnosis present

## 2015-12-13 DIAGNOSIS — R778 Other specified abnormalities of plasma proteins: Secondary | ICD-10-CM | POA: Diagnosis present

## 2015-12-13 DIAGNOSIS — Z79899 Other long term (current) drug therapy: Secondary | ICD-10-CM | POA: Diagnosis not present

## 2015-12-13 DIAGNOSIS — Z7902 Long term (current) use of antithrombotics/antiplatelets: Secondary | ICD-10-CM | POA: Diagnosis not present

## 2015-12-13 DIAGNOSIS — Z91018 Allergy to other foods: Secondary | ICD-10-CM

## 2015-12-13 DIAGNOSIS — R0902 Hypoxemia: Secondary | ICD-10-CM

## 2015-12-13 DIAGNOSIS — Z87891 Personal history of nicotine dependence: Secondary | ICD-10-CM

## 2015-12-13 DIAGNOSIS — I130 Hypertensive heart and chronic kidney disease with heart failure and stage 1 through stage 4 chronic kidney disease, or unspecified chronic kidney disease: Secondary | ICD-10-CM | POA: Diagnosis present

## 2015-12-13 DIAGNOSIS — N179 Acute kidney failure, unspecified: Secondary | ICD-10-CM | POA: Diagnosis present

## 2015-12-13 DIAGNOSIS — Z7982 Long term (current) use of aspirin: Secondary | ICD-10-CM | POA: Diagnosis not present

## 2015-12-13 DIAGNOSIS — R0603 Acute respiratory distress: Secondary | ICD-10-CM | POA: Diagnosis present

## 2015-12-13 DIAGNOSIS — J9601 Acute respiratory failure with hypoxia: Secondary | ICD-10-CM | POA: Diagnosis present

## 2015-12-13 DIAGNOSIS — R0602 Shortness of breath: Secondary | ICD-10-CM

## 2015-12-13 DIAGNOSIS — R942 Abnormal results of pulmonary function studies: Secondary | ICD-10-CM

## 2015-12-13 HISTORY — DX: Atherosclerotic heart disease of native coronary artery without angina pectoris: I25.10

## 2015-12-13 LAB — HEPATIC FUNCTION PANEL
ALBUMIN: 3.7 g/dL (ref 3.5–5.0)
ALK PHOS: 117 U/L (ref 38–126)
ALT: 20 U/L (ref 17–63)
AST: 30 U/L (ref 15–41)
Bilirubin, Direct: 0.3 mg/dL (ref 0.1–0.5)
Indirect Bilirubin: 0.4 mg/dL (ref 0.3–0.9)
TOTAL PROTEIN: 7.4 g/dL (ref 6.5–8.1)
Total Bilirubin: 0.7 mg/dL (ref 0.3–1.2)

## 2015-12-13 LAB — I-STAT VENOUS BLOOD GAS, ED
ACID-BASE DEFICIT: 1 mmol/L (ref 0.0–2.0)
BICARBONATE: 25.6 mmol/L (ref 20.0–28.0)
O2 SAT: 97 %
TCO2: 27 mmol/L (ref 0–100)
pCO2, Ven: 50.2 mmHg (ref 44.0–60.0)
pH, Ven: 7.315 (ref 7.250–7.430)
pO2, Ven: 104 mmHg — ABNORMAL HIGH (ref 32.0–45.0)

## 2015-12-13 LAB — BASIC METABOLIC PANEL
ANION GAP: 7 (ref 5–15)
BUN: 18 mg/dL (ref 6–20)
CHLORIDE: 104 mmol/L (ref 101–111)
CO2: 29 mmol/L (ref 22–32)
CREATININE: 1.83 mg/dL — AB (ref 0.61–1.24)
Calcium: 8.6 mg/dL — ABNORMAL LOW (ref 8.9–10.3)
GFR calc non Af Amer: 37 mL/min — ABNORMAL LOW (ref >60)
GFR, EST AFRICAN AMERICAN: 43 mL/min — AB (ref >60)
GLUCOSE: 174 mg/dL — AB (ref 65–99)
Potassium: 4.8 mmol/L (ref 3.5–5.1)
Sodium: 140 mmol/L (ref 135–145)

## 2015-12-13 LAB — CBC WITH DIFFERENTIAL/PLATELET
BASOS PCT: 1 %
Basophils Absolute: 0.2 10*3/uL — ABNORMAL HIGH (ref 0.0–0.1)
EOS PCT: 3 %
Eosinophils Absolute: 0.5 10*3/uL (ref 0.0–0.7)
HCT: 48 % (ref 39.0–52.0)
HEMOGLOBIN: 15.3 g/dL (ref 13.0–17.0)
LYMPHS PCT: 29 %
Lymphs Abs: 5 10*3/uL — ABNORMAL HIGH (ref 0.7–4.0)
MCH: 26.2 pg (ref 26.0–34.0)
MCHC: 31.9 g/dL (ref 30.0–36.0)
MCV: 82.3 fL (ref 78.0–100.0)
Monocytes Absolute: 1 10*3/uL (ref 0.1–1.0)
Monocytes Relative: 6 %
NEUTROS ABS: 10.4 10*3/uL — AB (ref 1.7–7.7)
NEUTROS PCT: 61 %
Platelets: 354 10*3/uL (ref 150–400)
RBC: 5.83 MIL/uL — ABNORMAL HIGH (ref 4.22–5.81)
RDW: 15.1 % (ref 11.5–15.5)
WBC: 17.1 10*3/uL — ABNORMAL HIGH (ref 4.0–10.5)

## 2015-12-13 LAB — I-STAT TROPONIN, ED: Troponin i, poc: 0.02 ng/mL (ref 0.00–0.08)

## 2015-12-13 LAB — BRAIN NATRIURETIC PEPTIDE: B Natriuretic Peptide: 188.1 pg/mL — ABNORMAL HIGH (ref 0.0–100.0)

## 2015-12-13 MED ORDER — ALBUTEROL (5 MG/ML) CONTINUOUS INHALATION SOLN
5.0000 mg/h | INHALATION_SOLUTION | Freq: Once | RESPIRATORY_TRACT | Status: DC
  Filled 2015-12-13: qty 20

## 2015-12-13 MED ORDER — IPRATROPIUM-ALBUTEROL 0.5-2.5 (3) MG/3ML IN SOLN
3.0000 mL | Freq: Four times a day (QID) | RESPIRATORY_TRACT | Status: DC
  Administered 2015-12-14: 3 mL via RESPIRATORY_TRACT
  Filled 2015-12-13: qty 3

## 2015-12-13 MED ORDER — ACETAMINOPHEN 325 MG PO TABS: 650.0000 mg | ORAL_TABLET | ORAL | Status: DC | PRN

## 2015-12-13 MED ORDER — SODIUM CHLORIDE 0.9% FLUSH
3.0000 mL | Freq: Two times a day (BID) | INTRAVENOUS | Status: DC
  Administered 2015-12-14 – 2015-12-15 (×2): 3 mL via INTRAVENOUS

## 2015-12-13 MED ORDER — IPRATROPIUM BROMIDE 0.02 % IN SOLN
0.5000 mg | Freq: Once | RESPIRATORY_TRACT | Status: AC
  Administered 2015-12-13: 0.5 mg via RESPIRATORY_TRACT
  Filled 2015-12-13: qty 2.5

## 2015-12-13 MED ORDER — ASPIRIN EC 81 MG PO TBEC
81.0000 mg | DELAYED_RELEASE_TABLET | Freq: Every day | ORAL | Status: DC
  Administered 2015-12-14 – 2015-12-15 (×2): 81 mg via ORAL
  Filled 2015-12-13 (×2): qty 1

## 2015-12-13 MED ORDER — SODIUM CHLORIDE 0.9 % IV SOLN: 250.0000 mL | INTRAVENOUS | Status: DC | PRN

## 2015-12-13 MED ORDER — NITROGLYCERIN IN D5W 200-5 MCG/ML-% IV SOLN
INTRAVENOUS | Status: AC
  Filled 2015-12-13: qty 250

## 2015-12-13 MED ORDER — NITROGLYCERIN IN D5W 200-5 MCG/ML-% IV SOLN
0.0000 ug/min | Freq: Once | INTRAVENOUS | Status: AC
  Administered 2015-12-13: 50 ug/min via INTRAVENOUS

## 2015-12-13 MED ORDER — ONDANSETRON HCL 4 MG/2ML IJ SOLN: 4.0000 mg | Freq: Four times a day (QID) | INTRAMUSCULAR | Status: DC | PRN

## 2015-12-13 MED ORDER — ALBUTEROL (5 MG/ML) CONTINUOUS INHALATION SOLN
15.0000 mg/h | INHALATION_SOLUTION | Freq: Once | RESPIRATORY_TRACT | Status: AC
  Administered 2015-12-13: 15 mg/h via RESPIRATORY_TRACT

## 2015-12-13 MED ORDER — METHYLPREDNISOLONE SODIUM SUCC 125 MG IJ SOLR
125.0000 mg | Freq: Once | INTRAMUSCULAR | Status: AC
  Administered 2015-12-13: 125 mg via INTRAVENOUS
  Filled 2015-12-13: qty 2

## 2015-12-13 MED ORDER — METOPROLOL TARTRATE 50 MG PO TABS
50.0000 mg | ORAL_TABLET | Freq: Two times a day (BID) | ORAL | Status: DC
  Administered 2015-12-14 – 2015-12-15 (×3): 50 mg via ORAL
  Filled 2015-12-13 (×2): qty 1
  Filled 2015-12-13: qty 2

## 2015-12-13 MED ORDER — DEXTROSE 5 % IV SOLN
1.0000 g | INTRAVENOUS | Status: DC
  Administered 2015-12-14: 1 g via INTRAVENOUS
  Filled 2015-12-13 (×3): qty 10

## 2015-12-13 MED ORDER — ALBUTEROL SULFATE HFA 108 (90 BASE) MCG/ACT IN AERS: 2.0000 | INHALATION_SPRAY | Freq: Four times a day (QID) | RESPIRATORY_TRACT | Status: DC | PRN

## 2015-12-13 MED ORDER — CLOPIDOGREL BISULFATE 75 MG PO TABS
75.0000 mg | ORAL_TABLET | Freq: Every day | ORAL | Status: DC
  Administered 2015-12-14: 75 mg via ORAL
  Filled 2015-12-13: qty 1

## 2015-12-13 MED ORDER — ROSUVASTATIN CALCIUM 40 MG PO TABS
40.0000 mg | ORAL_TABLET | Freq: Every day | ORAL | Status: DC
  Administered 2015-12-14: 40 mg via ORAL
  Filled 2015-12-13: qty 1

## 2015-12-13 MED ORDER — AMLODIPINE BESYLATE 10 MG PO TABS
10.0000 mg | ORAL_TABLET | Freq: Every day | ORAL | Status: DC
  Administered 2015-12-14: 10 mg via ORAL
  Filled 2015-12-13: qty 1

## 2015-12-13 MED ORDER — AZITHROMYCIN 500 MG PO TABS
500.0000 mg | ORAL_TABLET | Freq: Every day | ORAL | Status: DC
  Administered 2015-12-14: 500 mg via ORAL
  Filled 2015-12-13: qty 1

## 2015-12-13 MED ORDER — HYDROCODONE-ACETAMINOPHEN 5-325 MG PO TABS: 1.0000 | ORAL_TABLET | Freq: Four times a day (QID) | ORAL | Status: DC | PRN

## 2015-12-13 MED ORDER — SODIUM CHLORIDE 0.9% FLUSH
3.0000 mL | INTRAVENOUS | Status: DC | PRN
Start: 2015-12-13 — End: 2015-12-15

## 2015-12-13 MED ORDER — NITROGLYCERIN IN D5W 200-5 MCG/ML-% IV SOLN
0.0000 ug/min | INTRAVENOUS | Status: DC
  Administered 2015-12-14: 50 ug/min via INTRAVENOUS
  Filled 2015-12-13: qty 250

## 2015-12-13 MED ORDER — DEXTROSE 5 % IV SOLN
500.0000 mg | Freq: Once | INTRAVENOUS | Status: AC
  Administered 2015-12-13: 500 mg via INTRAVENOUS
  Filled 2015-12-13: qty 500

## 2015-12-13 MED ORDER — DEXTROSE 5 % IV SOLN
1.0000 g | Freq: Once | INTRAVENOUS | Status: AC
  Administered 2015-12-13: 1 g via INTRAVENOUS
  Filled 2015-12-13: qty 10

## 2015-12-13 NOTE — ED Triage Notes (Signed)
Patient presents with ems after having a sudden onset of chest pain and difficulty breathing at 2000 on ems arrival he sats were in the 60's, they placed him on CPAP and brought him up to 86%, he presented in severe respiratory distress, oxygen as low as 68% on CPAP blood pressure 200's systolic, edp at bedside along with respiratory, patient placed immediately on BIPAP and nitro drip started at 50 mc/min per VO Dr. Hyacinth MeekerMiller. Patient is alert and oriented complaining of pain in his chest

## 2015-12-13 NOTE — H&P (Signed)
History and Physical  Modena NunnerySharif A Rey WUJ:[id number] DOB: 07/23/1950 DOA: 12/13/2015  PCP:  [facility]   Chief Complaint:  Dyspnea   History of Present Illness:  Patient is a 65 yo male with hx of HTN, CKD, COPD who was brought with cc of acute onset dyspnea later this afternoon associated at that time with chest pain. Hx was taken with help from daughter as patient does not know English very well. He denied any chest pain at time of interview. No cough/fever/chills. No N/V/D/C/abd pain/dysuria. No other complaints. He has hx of "cardiac disease" but no hx of MI and no cardiologist.   Review of Systems:  CONSTITUTIONAL:     No night sweats.  No fatigue.  No fever. No chills. Eyes:                            No visual changes.  No eye pain.  No eye discharge.   ENT:                              No epistaxis.  No sinus pain.  No sore throat.   No congestion. RESPIRATORY:           No cough.  +wheeze.  No hemoptysis.  +dyspnea CARDIOVASCULAR   :  +chest pains.  No palpitations. GASTROINTESTINAL:  No abdominal pain.  No nausea. No vomiting.  No diarrhea. No constipation.  No hematemesis.  No hematochezia.  No melena. GENITOURINARY:      No urgency.  No frequency.  No dysuria.  No hematuria.  No obstructive symptoms.  No discharge.  No pain.   MUSCULOSKELETAL:  No musculoskeletal pain.  No joint swelling.  No arthritis. NEUROLOGICAL:        No confusion.  No weakness. No headache. No seizure. PSYCHIATRIC:             No depression. No anxiety. No suicidal ideation. SKIN:                             No rashes.  No lesions.  No wounds. ENDOCRINE:                No weight loss.  No polydipsia.  No polyuria.  No polyphagia. HEMATOLOGIC:           No purpura.  No petechiae.  No bleeding.  ALLERGIC                 : No pruritus.  No angioedema Other:  Past Medical and Surgical History:   Past Medical History:  Diagnosis Date  . Asthma   . Hyperlipidemia   . Hypertension     Past Surgical History:  Procedure Laterality Date  . CARDIAC CATHETERIZATION     10 years ago  . ELBOW SURGERY      Social History:   reports that he has quit smoking. He has never used smokeless tobacco. He reports that he does not drink alcohol or use drugs.    Allergies  Allergen Reactions  . Pork-Derived Products Other (See Comments)    Patient religious preference - no pork products    Family History  Problem Relation Age of Onset  . Hypertension Mother       Prior to Admission medications   Medication Sig Start Date End Date Taking? Authorizing Provider  albuterol (PROVENTIL HFA;VENTOLIN HFA) 108 (90 BASE) MCG/ACT inhaler Inhale 2 puffs into the lungs every 6 (six) hours as needed for wheezing or shortness of breath.   Yes Historical Provider, MD  amLODipine (NORVASC) 10 MG tablet Take 10 mg by mouth at bedtime.     Yes Historical Provider, MD  aspirin EC 81 MG tablet Take 81 mg by mouth daily.   Yes Historical Provider, MD  clopidogrel (PLAVIX) 75 MG tablet Take 75 mg by mouth at bedtime.     Yes Historical Provider, MD  HYDROcodone-acetaminophen (NORCO/VICODIN) 5-325 MG tablet Take 1-2 tablets by mouth every 6 hours as needed for pain and/or cough. 08/22/15  Yes Nicole Pisciotta, PA-C  ibuprofen (ADVIL,MOTRIN) 600 MG tablet Take 1 tablet (600 mg total) by mouth every 6 (six) hours as needed. Patient taking differently: Take 600 mg by mouth every 6 (six) hours as needed for moderate pain.  02/01/14  Yes Tiffany Neva Seat, PA-C  metoprolol tartrate (LOPRESSOR) 12.5 mg TABS tablet Take 2 tablets (50 mg total) by mouth 2 (two) times daily. 10/24/13  Yes Meredeth Ide, MD  rosuvastatin (CRESTOR) 10 MG tablet Take 40 mg by mouth at bedtime.    Yes Historical Provider, MD    Physical Exam: BP 136/82   Pulse 80   Temp 99.2 F (37.3 C) (Axillary)   Resp 17   Ht 5\' 7"  (1.702 m)   Wt 74.8 kg (165 lb)   SpO2 98%   BMI 25.84 kg/m   GENERAL :   Alert and cooperative, and  appears to be in mild acute distress. HEAD:           normocephalic. EYES:            PERRL, EOMI.   EARS:           hearing grossly intact. THROAT:     Oral cavity and pharynx normal.   NECK:          supple CARDIAC:    Normal S1 and S2. No gallop. No murmurs.  Vascular:     no peripheral edema. LUNGS:       BiPAP breathing sounds with mild crackles  ABDOMEN: Positive bowel sounds. Soft, nondistended, nontender. No guarding or rebound.      MSK:           No joint erythema or tenderness. EXT           : No significant deformity or joint abnormality. Neuro        : Alert, oriented to person, place, and time. SKIN:            No rash. No lesions. PSYCH:       No hallucination. Patient is not suicidal.          Labs on Admission:  Reviewed.   Radiological Exams on Admission: Dg Chest Portable 1 View  Result Date: 12/13/2015 CLINICAL DATA:  Respiratory distress EXAM: PORTABLE CHEST 1 VIEW COMPARISON:  05/10/2014 FINDINGS: Mild cardiac enlargement. Diffuse bilateral airspace and interstitial infiltrates, predominantly perihilar in distribution. Changes suggest edema or diffuse pneumonia. No blunting of costophrenic angles. No pneumothorax. Calcification of the aorta. Degenerative changes in the spine and shoulders. IMPRESSION: Mild cardiac enlargement. Diffuse bilateral pulmonary infiltrates suggesting edema or bilateral pneumonia. Electronically Signed   By: Burman Nieves M.D.   On: 12/13/2015 21:25    EKG:  Independently reviewed. No ischemic ST changes. LVH.  Assessment/Plan  Acute hypoxic respiratory failure:  Likely due to acute  decompensated HF: Likely due to hypertensive urgency and flash pulmonary edema Continue Nitro drip and start PO meds  No findings of fluid overload, holding on lasix for now Continue BB and Norvasc, may need to add ACEI or hydralazine  Initial EKG and trops are unremarkable. Will trend trops. , r/o ACS Admit to stepdown with BiPAP Will check  Echo  Possible CAP: Started on Ceft and azithromycin   AKI on CKD: Likely due to HTN Continue to monitor   Hypertensive urgency:  Started on Nitro drip and home PO meds.   Input & Output: ordered  Lines & Tubes: PIV DVT prophylaxis: SCDs GI prophylaxis: NA Consultants: NA Code Status: Full Family Communication: daughter at bedside  Disposition Plan: Loretha StaplerStepdown     Elgie Maziarz M.D [facility]

## 2015-12-13 NOTE — ED Provider Notes (Signed)
[facility]PT Provider Note   CSN: [id number] Arrival date & time: 12/13/15  2054     History   Chief Complaint Chief Complaint  Patient presents with  . Respiratory Distress    HPI Kevin Robinson is a 65 y.o. male.  HPI Patient is a 65 year old male with past medical history significant for asthma, smoking, hyperlipidemia, hypertension, "heart problem" per daughter, who presents with respiratory distress. Further history obtained from daughter as patient in severe respiratory distress and mainly speaks [place]. Patient was in his usual state of health today when he went to bed at 7:30 PM. He awakened just prior to arrival with acute onset chest discomfort and shortness of breath. EMS was called and patient was reportedly hypoxic with O2 sats in the mid 60s prior to arrival. He is placed on BiPAP with improvement to the 80s. He is also significantly hypertensive with systolic blood pressures in the 220s prior to arrival. Per daughter patient has a chronic cough without recent changes. No fevers, chills, vomiting, diarrhea or other recent infectious symptoms.  Past Medical History:  Diagnosis Date  . Asthma   . Hyperlipidemia   . Hypertension     Patient Active Problem List   Diagnosis Date Noted  . Pulmonary edema 12/13/2015  . AKI (acute kidney injury) (HCC) 10/23/2013  . Hypertension 10/23/2013  . Asthma 10/23/2013  . Diarrhea 10/23/2013  . Acute renal failure (HCC) 10/23/2013    Past Surgical History:  Procedure Laterality Date  . CARDIAC CATHETERIZATION     10 years ago  . ELBOW SURGERY         Home Medications    Prior to Admission medications   Medication Sig Start Date End Date Taking? Authorizing Provider  albuterol (PROVENTIL HFA;VENTOLIN HFA) 108 (90 BASE) MCG/ACT inhaler Inhale 2 puffs into the lungs every 6 (six) hours as needed for wheezing or shortness of breath.   Yes Historical Provider, MD  amLODipine (NORVASC) 10 MG tablet Take 10 mg by  mouth at bedtime.     Yes Historical Provider, MD  aspirin EC 81 MG tablet Take 81 mg by mouth daily.   Yes Historical Provider, MD  clopidogrel (PLAVIX) 75 MG tablet Take 75 mg by mouth at bedtime.     Yes Historical Provider, MD  HYDROcodone-acetaminophen (NORCO/VICODIN) 5-325 MG tablet Take 1-2 tablets by mouth every 6 hours as needed for pain and/or cough. 08/22/15  Yes Nicole Pisciotta, PA-C  ibuprofen (ADVIL,MOTRIN) 600 MG tablet Take 1 tablet (600 mg total) by mouth every 6 (six) hours as needed. Patient taking differently: Take 600 mg by mouth every 6 (six) hours as needed for moderate pain.  02/01/14  Yes Tiffany Neva Seat, PA-C  metoprolol tartrate (LOPRESSOR) 12.5 mg TABS tablet Take 2 tablets (50 mg total) by mouth 2 (two) times daily. 10/24/13  Yes Meredeth Ide, MD  rosuvastatin (CRESTOR) 10 MG tablet Take 40 mg by mouth at bedtime.    Yes Historical Provider, MD    Family History Family History  Problem Relation Age of Onset  . Hypertension Mother     Social History Social History  Substance Use Topics  . Smoking status: Former Games developer  . Smokeless tobacco: Never Used  . Alcohol use No     Allergies   Pork-derived products   Review of Systems Review of Systems  Constitutional: Negative for chills and fever.  HENT: Negative for ear pain and sore throat.   Eyes: Negative for pain and visual disturbance.  Respiratory:  Positive for cough and shortness of breath.   Cardiovascular: Positive for chest pain. Negative for palpitations.  Gastrointestinal: Negative for abdominal pain and vomiting.  Genitourinary: Negative for dysuria and hematuria.  Musculoskeletal: Negative for arthralgias and back pain.  Skin: Negative for color change and rash.  Neurological: Negative for seizures and syncope.  All other systems reviewed and are negative.    Physical Exam Updated Vital Signs BP 136/82   Pulse 80   Temp 99.2 F (37.3 C) (Axillary)   Resp 17   Ht 5\' 7"  (1.702 m)   Wt  74.8 kg   SpO2 98%   BMI 25.84 kg/m   Physical Exam  Constitutional: He appears well-developed and well-nourished. He appears distressed.  HENT:  Head: Normocephalic and atraumatic.  Eyes: EOM are normal.  Neck: Neck supple.  Cardiovascular: Regular rhythm and intact distal pulses.  Tachycardia present.   Pulmonary/Chest: Accessory muscle usage present. Tachypnea noted. He is in respiratory distress.  Decreased breath sounds in all lung fields with rhonchi and scattered expiratory wheezes.  Abdominal: Soft. He exhibits no distension. There is no tenderness.  Musculoskeletal: He exhibits no edema.  Neurological: He is alert.  Skin: Skin is warm and dry.  Nursing note and vitals reviewed.    ED Treatments / Results  Labs (all labs ordered are listed, but only abnormal results are displayed) Labs Reviewed  BASIC METABOLIC PANEL - Abnormal; Notable for the following:       Result Value   Glucose, Bld 174 (*)    Creatinine, Ser 1.83 (*)    Calcium 8.6 (*)    GFR calc non Af Amer 37 (*)    GFR calc Af Amer 43 (*)    All other components within normal limits  CBC WITH DIFFERENTIAL/PLATELET - Abnormal; Notable for the following:    WBC 17.1 (*)    RBC 5.83 (*)    Neutro Abs 10.4 (*)    Lymphs Abs 5.0 (*)    Basophils Absolute 0.2 (*)    All other components within normal limits  BRAIN NATRIURETIC PEPTIDE - Abnormal; Notable for the following:    B Natriuretic Peptide 188.1 (*)    All other components within normal limits  I-STAT VENOUS BLOOD GAS, ED - Abnormal; Notable for the following:    pO2, Ven 104.0 (*)    All other components within normal limits  CULTURE, BLOOD (ROUTINE X 2)  CULTURE, BLOOD (ROUTINE X 2)  HEPATIC FUNCTION PANEL  I-STAT TROPOININ, ED  I-STAT ARTERIAL BLOOD GAS, ED    EKG  EKG Interpretation None       Radiology Dg Chest Portable 1 View  Result Date: 12/13/2015 CLINICAL DATA:  Respiratory distress EXAM: PORTABLE CHEST 1 VIEW COMPARISON:   05/10/2014 FINDINGS: Mild cardiac enlargement. Diffuse bilateral airspace and interstitial infiltrates, predominantly perihilar in distribution. Changes suggest edema or diffuse pneumonia. No blunting of costophrenic angles. No pneumothorax. Calcification of the aorta. Degenerative changes in the spine and shoulders. IMPRESSION: Mild cardiac enlargement. Diffuse bilateral pulmonary infiltrates suggesting edema or bilateral pneumonia. Electronically Signed   By: Burman NievesWilliam  Stevens M.D.   On: 12/13/2015 21:25    Procedures Procedures (including critical care time)  Medications Ordered in ED Medications  nitroGLYCERIN 0.2 mg/mL in dextrose 5 % infusion (not administered)  azithromycin (ZITHROMAX) 500 mg in dextrose 5 % 250 mL IVPB (not administered)  albuterol (PROVENTIL HFA;VENTOLIN HFA) 108 (90 Base) MCG/ACT inhaler 2 puff (not administered)  amLODipine (NORVASC) tablet 10 mg (not administered)  aspirin EC tablet 81 mg (not administered)  clopidogrel (PLAVIX) tablet 75 mg (not administered)  HYDROcodone-acetaminophen (NORCO/VICODIN) 5-325 MG per tablet 1 tablet (not administered)  metoprolol tartrate (LOPRESSOR) tablet 50 mg (not administered)  rosuvastatin (CRESTOR) tablet 40 mg (not administered)  ipratropium (ATROVENT) nebulizer solution 0.5 mg (0.5 mg Nebulization Given 12/13/15 2117)  methylPREDNISolone sodium succinate (SOLU-MEDROL) 125 mg/2 mL injection 125 mg (125 mg Intravenous Given 12/13/15 2118)  nitroGLYCERIN 50 mg in dextrose 5 % 250 mL (0.2 mg/mL) infusion (50 mcg/min Intravenous New Bag/Given 12/13/15 2100)  albuterol (PROVENTIL,VENTOLIN) solution continuous neb (15 mg/hr Nebulization Given 12/13/15 2117)  cefTRIAXone (ROCEPHIN) 1 g in dextrose 5 % 50 mL IVPB (1 g Intravenous New Bag/Given 12/13/15 2236)     Initial Impression / Assessment and Plan / ED Course  I have reviewed the triage vital signs and the nursing notes.  Pertinent labs & imaging results that were available  during my care of the patient were reviewed by me and considered in my medical decision making (see chart for details).  Clinical Course     Patient is a 65 year old male with past medical history as above who presents in respiratory distress. On arrival patient is hypertensive with blood pressure 223/113, mildly tachycardic in the 100s, O2 sats initially in the 70s. He was placed on BiPAP with 100% FiO2 with improvement of O2 sats. Chest x-ray obtained and consistent with pulmonary edema. Started on a nitro drip. In addition patient was sent to be wheezing on exam and was treated with continuous abdominal nebulizer, IV steroids for possible asthma exacerbation.  EKG without acute ischemic changes. Initial VBG without respiratory acidosis. Labs significant for leukocytosis of 17, worsening renal function with creatinine 1.8 (compared to 1.1 at baseline). Troponin within normal range. BNP 188.  Work of breathing improved on BiPAP. Blood pressure improved to systolics in the 140s on the nitro drip. Presentation consistent with flash pulmonary edema due to hypertension. Rule out underlying pneumonia. Patient was covered for community acquired pneumonia with azithromycin and Rocephin. Blood cultures sent.  Will be admitted to the stepdown unit with Dr. Mickle MalloryHamad attending for further management.  Final Clinical Impressions(s) / ED Diagnoses   Final diagnoses:  Acute pulmonary edema [facility](HCC)  Hypertensive emergency  Hypoxia    New Prescriptions New Prescriptions   No medications on file     Isa RankinAnn B Ebbie Cherry, MD 12/13/15 2346    Eber HongBrian Miller, MD 12/14/15 0001

## 2015-12-14 ENCOUNTER — Encounter (HOSPITAL_COMMUNITY): Payer: Self-pay | Admitting: General Practice

## 2015-12-14 DIAGNOSIS — J810 Acute pulmonary edema: Secondary | ICD-10-CM

## 2015-12-14 LAB — BASIC METABOLIC PANEL
ANION GAP: 8 (ref 5–15)
BUN: 22 mg/dL — ABNORMAL HIGH (ref 6–20)
CALCIUM: 8.4 mg/dL — AB (ref 8.9–10.3)
CO2: 22 mmol/L (ref 22–32)
Chloride: 108 mmol/L (ref 101–111)
Creatinine, Ser: 1.65 mg/dL — ABNORMAL HIGH (ref 0.61–1.24)
GFR, EST AFRICAN AMERICAN: 49 mL/min — AB (ref >60)
GFR, EST NON AFRICAN AMERICAN: 42 mL/min — AB (ref >60)
Glucose, Bld: 172 mg/dL — ABNORMAL HIGH (ref 65–99)
Potassium: 4.2 mmol/L (ref 3.5–5.1)
SODIUM: 138 mmol/L (ref 135–145)

## 2015-12-14 LAB — TROPONIN I
TROPONIN I: 0.08 ng/mL — AB (ref <0.03)
TROPONIN I: 0.08 ng/mL — AB (ref <0.03)
Troponin I: 0.1 ng/mL (ref <0.03)

## 2015-12-14 LAB — MRSA PCR SCREENING: MRSA BY PCR: NEGATIVE

## 2015-12-14 NOTE — Progress Notes (Signed)
Pt removed both IVs and refusing another IV insertion. MD made aware.

## 2015-12-14 NOTE — ED Notes (Signed)
Patient readjusted in bed and breakfast tray setup at bedside

## 2015-12-14 NOTE — ED Notes (Signed)
Admitting Md paged to RN; no response as of [phone]

## 2015-12-14 NOTE — Progress Notes (Signed)
[facility] Progress Note  Patient: Kevin NunnerySharif A Delapena WUJ:[id number]   PCP: [facility] DOB: 06/19/1950   DOA: 12/13/2015   DOS: 12/14/2015   Date of Service: the patient was seen and examined on 12/14/2015  Brief hospital course: Pt. with PMH of HTN, CKD, COPD; admitted on 12/13/2015, with complaint of sudden onset of shortness of breath, was found to have bilateral pneumonia with possible sepsis and possible acute on chronic CHF. Currently further plan is continue close monitoring.  Assessment and Plan: 1. Acute hypoxic respiratory failure:  Likely due to acute on chronic decompensated HF: Likely due to hypertensive urgency and flash pulmonary edema Possibility of pneumonia cannot be ruled out.  Was started on Nitro drip, currently we will discontinue No findings of fluid overload, holding on lasix for now in the setting of renal dysfunction. Continue BB and Norvasc, may need to add ACEI or hydralazine  Initial EKG and trops are unremarkable. Will trend trops. , r/o ACS Will check Echo  2. Possible CAP: Started on Ceft and azithromycin  Monitor cultures.  3. AKI on CKD: Likely due to HTN Continue to monitor   4. Hypertensive urgency:  Continue home medication. When necessary hydralazine. Discontinue nitroglycerin drip.  Pain management: When necessary Tylenol Activity: Consulted physical therapy Bowel regimen: last BM prior to admission Diet: Cardiac diet DVT Prophylaxis: subcutaneous Heparin  Advance goals of care discussion: Full code  Family Communication: family was present at bedside, at the time of interview. The pt provided permission to discuss medical plan with the family. Opportunity was given to ask question and all questions were answered satisfactorily.   Disposition:  Discharge to home. Expected discharge date: 12/15/2015,   Consultants: None Procedures: Echocardiogram  Antibiotics: Anti-infectives    Start     Dose/Rate Route Frequency  Ordered Stop   12/14/15 2200  azithromycin (ZITHROMAX) tablet 500 mg     500 mg Oral Daily at bedtime 12/13/15 2348     12/14/15 2200  cefTRIAXone (ROCEPHIN) 1 g in dextrose 5 % 50 mL IVPB     1 g 100 mL/hr over 30 Minutes Intravenous Every 24 hours 12/13/15 2349     12/13/15 2215  azithromycin (ZITHROMAX) 500 mg in dextrose 5 % 250 mL IVPB     500 mg 250 mL/hr over 60 Minutes Intravenous  Once 12/13/15 2207 12/14/15 0505   12/13/15 2215  cefTRIAXone (ROCEPHIN) 1 g in dextrose 5 % 50 mL IVPB     1 g 100 mL/hr over 30 Minutes Intravenous  Once 12/13/15 2207 12/13/15 2306        Subjective: Patient wants to go home and states that he cannot pray here in the hospital. At present his and is starting the risks and currently staying in the hospital. Denies any acute complaint and mentions his shortness of breath has improved. No chest pain or chest tightness or chest pressure.   Objective: Physical Exam: Vitals:   12/14/15 1230 12/14/15 1312 12/14/15 1600 12/14/15 1704  BP: 152/67 (!) 132/98  (!) 137/59  Pulse: 94 85 85 84  Resp: 18 19 18 20   Temp:  98 F (36.7 C)  98.6 F (37 C)  TempSrc:  Oral  Oral  SpO2: 93% 93% 100% 94%  Weight:      Height:        Intake/Output Summary (Last 24 hours) at 12/14/15 1857 Last data filed at 12/14/15 1500  Gross per 24 hour  Intake  552 ml  Output              150 ml  Net              402 ml   Filed Weights   12/13/15 2114  Weight: 74.8 kg (165 lb)    General: Alert, Awake and Oriented to Time, Place and Person. Appear in mild distress, affect appropriate Eyes: PERRL, Conjunctiva normal ENT: Oral Mucosa clear moist. Neck: positive JVD, no Abnormal Mass Or lumps Cardiovascular: S1 and S2 Present, no Murmur, Respiratory: Bilateral Air entry equal and Decreased, no use of accessory muscle, bilateral Crackles, no wheezes Abdomen: Bowel Sound [present, Soft and no tenderness Skin: no redness, no Rash, no  induration Extremities: no Pedal edema, no calf tenderness Neurologic: Grossly no focal neuro deficit. Bilaterally Equal motor strength  Data Reviewed: CBC:  Recent Labs Lab 12/13/15 2100  WBC 17.1*  NEUTROABS 10.4*  HGB 15.3  HCT 48.0  MCV 82.3  PLT 354   Basic Metabolic Panel:  Recent Labs Lab 12/13/15 2100 12/14/15 0303  NA 140 138  K 4.8 4.2  CL 104 108  CO2 29 22  GLUCOSE 174* 172*  BUN 18 22*  CREATININE 1.83* 1.65*  CALCIUM 8.6* 8.4*    Liver Function Tests:  Recent Labs Lab 12/13/15 2100  AST 30  ALT 20  ALKPHOS 117  BILITOT 0.7  PROT 7.4  ALBUMIN 3.7   No results for input(s): LIPASE, AMYLASE in the last 168 hours. No results for input(s): AMMONIA in the last 168 hours. Coagulation Profile: No results for input(s): INR, PROTIME in the last 168 hours. Cardiac Enzymes:  Recent Labs Lab 12/14/15 0303 12/14/15 1408  TROPONINI 0.08* 0.08*   BNP (last 3 results) No results for input(s): PROBNP in the last 8760 hours.  CBG: No results for input(s): GLUCAP in the last 168 hours.  Studies: Dg Chest Portable 1 View  Result Date: 12/13/2015 CLINICAL DATA:  Respiratory distress EXAM: PORTABLE CHEST 1 VIEW COMPARISON:  05/10/2014 FINDINGS: Mild cardiac enlargement. Diffuse bilateral airspace and interstitial infiltrates, predominantly perihilar in distribution. Changes suggest edema or diffuse pneumonia. No blunting of costophrenic angles. No pneumothorax. Calcification of the aorta. Degenerative changes in the spine and shoulders. IMPRESSION: Mild cardiac enlargement. Diffuse bilateral pulmonary infiltrates suggesting edema or bilateral pneumonia. Electronically Signed   By: Burman NievesWilliam  Stevens M.D.   On: 12/13/2015 21:25     Scheduled Meds: . amLODipine  10 mg Oral QHS  . aspirin EC  81 mg Oral Daily  . azithromycin  500 mg Oral QHS  . cefTRIAXone (ROCEPHIN)  IV  1 g Intravenous Q24H  . clopidogrel  75 mg Oral QHS  . ipratropium-albuterol  3 mL  Nebulization Q6H  . metoprolol tartrate  50 mg Oral BID  . rosuvastatin  40 mg Oral QHS  . sodium chloride flush  3 mL Intravenous Q12H   Continuous Infusions: PRN Meds: sodium chloride, acetaminophen, HYDROcodone-acetaminophen, ondansetron (ZOFRAN) IV, sodium chloride flush  Time spent: 30 minutes  Author: Lynden OxfordPranav Geanine Vandekamp, MD [facility] Pager: [phone] 12/14/2015 6:57 PM  If 7PM-7AM, please contact night-coverage at [email or web address], password [email or web address]

## 2015-12-14 NOTE — Care Management Note (Signed)
Case Management Note  Patient Details  Name: Kevin Robinson MRN: [id number] Date of Birth: 02/05/1950  Subjective/Objective:                  65 yo male with hx of HTN, CKD, COPD who was brought with cc of acute onset dyspnea later this afternoon associated at that time with chest pain.  Patient does not speak English very well, mainly speaks [place] (daughter speaks and understands [place])./ From home with spouse, family.  Action/Plan: Follow for disposition needs.   Expected Discharge Date:  12/18/15               Expected Discharge Plan:  Home w Home Health Services  In-House Referral:  NA  Discharge planning Services  CM Consult  Post Acute Care Choice:    Choice offered to:     DME Arranged:    DME Agency:     HH Arranged:    HH Agency:     Status of Service:  In process, will continue to follow  If discussed at Long Length of Stay Meetings, dates discussed:    Additional Comments:  Oletta CohnWood, Milton Streicher, RN 12/14/2015, 9:32 AM

## 2015-12-14 NOTE — Progress Notes (Signed)
Patient arrived in the unit accompanied by RN and NT via bed. Orientation to the unit given.Patient verbalizes understanding. 

## 2015-12-14 NOTE — ED Notes (Signed)
RN informed admitting Md of elevated trop and pt current stable status;

## 2015-12-14 NOTE — Progress Notes (Signed)
Report called to 3E RN. 

## 2015-12-15 ENCOUNTER — Inpatient Hospital Stay (HOSPITAL_COMMUNITY): Payer: Medicare Other

## 2015-12-15 DIAGNOSIS — J189 Pneumonia, unspecified organism: Secondary | ICD-10-CM | POA: Diagnosis present

## 2015-12-15 DIAGNOSIS — I509 Heart failure, unspecified: Secondary | ICD-10-CM

## 2015-12-15 DIAGNOSIS — I161 Hypertensive emergency: Secondary | ICD-10-CM | POA: Diagnosis present

## 2015-12-15 DIAGNOSIS — N183 Chronic kidney disease, stage 3 unspecified: Secondary | ICD-10-CM | POA: Diagnosis present

## 2015-12-15 DIAGNOSIS — R7989 Other specified abnormal findings of blood chemistry: Secondary | ICD-10-CM

## 2015-12-15 DIAGNOSIS — R778 Other specified abnormalities of plasma proteins: Secondary | ICD-10-CM | POA: Diagnosis present

## 2015-12-15 DIAGNOSIS — Z9114 Patient's other noncompliance with medication regimen: Secondary | ICD-10-CM

## 2015-12-15 LAB — CBC WITH DIFFERENTIAL/PLATELET
BASOS ABS: 0 10*3/uL (ref 0.0–0.1)
BASOS PCT: 0 %
EOS ABS: 0 10*3/uL (ref 0.0–0.7)
Eosinophils Relative: 0 %
HCT: 37.8 % — ABNORMAL LOW (ref 39.0–52.0)
HEMOGLOBIN: 12.4 g/dL — AB (ref 13.0–17.0)
LYMPHS PCT: 17 %
Lymphs Abs: 3.8 10*3/uL (ref 0.7–4.0)
MCH: 26.4 pg (ref 26.0–34.0)
MCHC: 32.8 g/dL (ref 30.0–36.0)
MCV: 80.6 fL (ref 78.0–100.0)
Monocytes Absolute: 1.8 10*3/uL — ABNORMAL HIGH (ref 0.1–1.0)
Monocytes Relative: 8 %
NEUTROS PCT: 75 %
Neutro Abs: 16.7 10*3/uL — ABNORMAL HIGH (ref 1.7–7.7)
PLATELETS: 298 10*3/uL (ref 150–400)
RBC: 4.69 MIL/uL (ref 4.22–5.81)
RDW: 15 % (ref 11.5–15.5)
WBC: 22.3 10*3/uL — AB (ref 4.0–10.5)

## 2015-12-15 LAB — URINALYSIS W MICROSCOPIC (NOT AT ARMC)
BILIRUBIN URINE: NEGATIVE
Bacteria, UA: NONE SEEN
GLUCOSE, UA: NEGATIVE mg/dL
Ketones, ur: NEGATIVE mg/dL
Leukocytes, UA: NEGATIVE
NITRITE: NEGATIVE
PH: 7.5 (ref 5.0–8.0)
Protein, ur: 30 mg/dL — AB
SPECIFIC GRAVITY, URINE: 1.01 (ref 1.005–1.030)

## 2015-12-15 LAB — COMPREHENSIVE METABOLIC PANEL
ALBUMIN: 3.1 g/dL — AB (ref 3.5–5.0)
ALT: 14 U/L — AB (ref 17–63)
AST: 18 U/L (ref 15–41)
Alkaline Phosphatase: 80 U/L (ref 38–126)
Anion gap: 7 (ref 5–15)
BUN: 34 mg/dL — AB (ref 6–20)
CHLORIDE: 107 mmol/L (ref 101–111)
CO2: 24 mmol/L (ref 22–32)
CREATININE: 1.58 mg/dL — AB (ref 0.61–1.24)
Calcium: 8.7 mg/dL — ABNORMAL LOW (ref 8.9–10.3)
GFR calc Af Amer: 51 mL/min — ABNORMAL LOW (ref >60)
GFR, EST NON AFRICAN AMERICAN: 44 mL/min — AB (ref >60)
GLUCOSE: 115 mg/dL — AB (ref 65–99)
Potassium: 4 mmol/L (ref 3.5–5.1)
Sodium: 138 mmol/L (ref 135–145)
Total Bilirubin: 0.2 mg/dL — ABNORMAL LOW (ref 0.3–1.2)
Total Protein: 6.3 g/dL — ABNORMAL LOW (ref 6.5–8.1)

## 2015-12-15 LAB — D-DIMER, QUANTITATIVE (NOT AT ARMC): D DIMER QUANT: 0.88 ug{FEU}/mL — AB (ref 0.00–0.50)

## 2015-12-15 LAB — PROTIME-INR
INR: 1.05
Prothrombin Time: 13.7 seconds (ref 11.4–15.2)

## 2015-12-15 LAB — ECHOCARDIOGRAM COMPLETE
HEIGHTINCHES: 67 in
Weight: 2753.6 oz

## 2015-12-15 LAB — TROPONIN I: TROPONIN I: 0.1 ng/mL — AB (ref <0.03)

## 2015-12-15 LAB — MAGNESIUM: MAGNESIUM: 2 mg/dL (ref 1.7–2.4)

## 2015-12-15 MED ORDER — LEVOFLOXACIN 500 MG PO TABS: 500.0000 mg | ORAL_TABLET | ORAL | 0 refills | Status: AC

## 2015-12-15 MED ORDER — HYDRALAZINE HCL 50 MG PO TABS: 50.0000 mg | ORAL_TABLET | Freq: Three times a day (TID) | ORAL | 0 refills | Status: AC

## 2015-12-15 MED ORDER — PREDNISONE 10 MG PO TABS: ORAL_TABLET | ORAL | 0 refills | Status: AC

## 2015-12-15 MED ORDER — ISOSORBIDE MONONITRATE ER 30 MG PO TB24: 30.0000 mg | ORAL_TABLET | Freq: Every day | ORAL | 0 refills | Status: AC

## 2015-12-15 MED ORDER — AMLODIPINE BESYLATE 10 MG PO TABS: 10.0000 mg | ORAL_TABLET | Freq: Every day | ORAL | 0 refills | Status: AC

## 2015-12-15 MED ORDER — PREDNISONE 50 MG PO TABS
50.0000 mg | ORAL_TABLET | Freq: Every day | ORAL | Status: DC
  Administered 2015-12-15: 50 mg via ORAL
  Filled 2015-12-15: qty 1

## 2015-12-15 MED ORDER — HYDRALAZINE HCL 50 MG PO TABS
50.0000 mg | ORAL_TABLET | Freq: Three times a day (TID) | ORAL | Status: DC
  Administered 2015-12-15: 50 mg via ORAL
  Filled 2015-12-15: qty 1

## 2015-12-15 MED ORDER — HYDRALAZINE HCL 20 MG/ML IJ SOLN
5.0000 mg | Freq: Once | INTRAMUSCULAR | Status: AC
  Administered 2015-12-15: 5 mg via INTRAVENOUS
  Filled 2015-12-15: qty 1

## 2015-12-15 MED ORDER — BUDESONIDE-FORMOTEROL FUMARATE 80-4.5 MCG/ACT IN AERO: 2.0000 | INHALATION_SPRAY | Freq: Two times a day (BID) | RESPIRATORY_TRACT | 0 refills | Status: AC

## 2015-12-15 MED ORDER — IPRATROPIUM-ALBUTEROL 0.5-2.5 (3) MG/3ML IN SOLN
3.0000 mL | Freq: Three times a day (TID) | RESPIRATORY_TRACT | Status: DC
  Administered 2015-12-15 (×2): 3 mL via RESPIRATORY_TRACT
  Filled 2015-12-15 (×2): qty 3

## 2015-12-15 MED ORDER — LEVOFLOXACIN 500 MG PO TABS: 500.0000 mg | ORAL_TABLET | ORAL | Status: DC

## 2015-12-15 MED ORDER — ISOSORBIDE MONONITRATE ER 30 MG PO TB24
30.0000 mg | ORAL_TABLET | Freq: Every day | ORAL | Status: DC
  Administered 2015-12-15: 30 mg via ORAL
  Filled 2015-12-15: qty 1

## 2015-12-15 MED ORDER — METOPROLOL TARTRATE 50 MG PO TABS: 50.0000 mg | ORAL_TABLET | Freq: Two times a day (BID) | ORAL | 0 refills | Status: AC

## 2015-12-15 NOTE — Progress Notes (Signed)
Call placed to CCMD to notify of telemetry monitoring d/c. Monitor removed for d/c.

## 2015-12-15 NOTE — Progress Notes (Signed)
Pt transported from room for echocardiogram. Daughter remains at bedside.

## 2015-12-15 NOTE — Progress Notes (Signed)
  Echocardiogram 2D Echocardiogram has been performed.  Kevin Robinson, Kevin Robinson 12/15/2015, 11:41 AM

## 2015-12-15 NOTE — Progress Notes (Signed)
Pt returns from Echo.

## 2015-12-19 LAB — CULTURE, BLOOD (ROUTINE X 2)
CULTURE: NO GROWTH
Culture: NO GROWTH

## 2015-12-19 NOTE — Discharge Summary (Signed)
[facility] Discharge Summary   Patient: Kevin Robinson UJW:[id number]:[id number]   [facility]P: [facility] DOB: 07/16/1950   Date of admission: 12/13/2015   Date of discharge: 12/15/2015    Discharge Diagnoses:  Principal Problem:   Hypertensive emergency Active Problems:   Pulmonary edema   CKD (chronic kidney disease) stage 3, GFR 30-59 ml/min   CAP (community acquired pneumonia)   Elevated troponin   Non compliance w medication regimen   Admitted From: home Disposition:  home  Recommendations for Outpatient Follow-up:  1. Please follow up with PCP in 1 week. 2. Please establish care with cardiology    Follow-up Information    [facility]. Schedule an appointment as soon as possible for a visit on 12/18/2015.   Specialty:  Internal Medicine Why:  At 9:45 AM Contact information: [address] [place] [address] [phone]          Diet recommendation: cardiac diet  Activity: The patient is advised to gradually reintroduce usual activities.  Discharge Condition: good  Code Status: full code  History of present illness: As per the H and P dictated on admission, "Patient is a 65 yo male with hx of HTN, CKD, COPD who was brought with cc of acute onset dyspnea later this afternoon associated at that time with chest pain. Hx was taken with help from daughter as patient does not know English very well. He denied any chest pain at time of interview. No cough/fever/chills. No N/V/D/C/abd pain/dysuria. No other complaints. He has hx of "cardiac disease" but no hx of MI and no cardiologist"  Hospital Course:   Summary of his active problems in the hospital is as following. 1. Acute hypoxic respiratory failure:  Likely due to acute on chronic decompensated HF: Likely due to hypertensive urgency and flash pulmonary edema Possibility of pneumonia cannot be ruled out.  Was started on Nitro drip, currently we will discontinue No findings of fluid overload, holding on  lasix for now in the setting of renal dysfunction. Continue BB and Norvasc, Initial EKG and trops are unremarkable.  ECHO unremarkable . Pt refused further work up in hospital. cariology consulted and will follow as an outpatient.  Pt refused to stay longer to completed cardiac work up.   2. Possible CAP: Started on Ceft and azithromycin, complete with ,levafloxacin. Monitor cultures.  3. AKI on CKD: Likely due to HTN Continue to monitor   4. Hypertensive urgency:  Continue home medication. When necessary hydralazine. Discontinue nitroglycerin drip. Add imdur hydralazine.  Pt refused to stay in hospital prior to dose finding process can not be completed for HTN.    All other chronic medical condition were stable during the hospitalization.  Patient was ambulatory without any assistance. On the day of the discharge the patient's vitals were stable, and no other acute medical condition were reported by patient. the patient was felt safe to be discharge at home with famioly.  Procedures and Results:  echocardiogram   Consultations:  none  DISCHARGE MEDICATION: Discharge Medication List as of 12/15/2015  4:21 PM    START taking these medications   Details  budesonide-formoterol (SYMBICORT) 80-4.5 MCG/ACT inhaler Inhale 2 puffs into the lungs 2 (two) times daily., Starting Tue 12/15/2015, Normal    hydrALAZINE (APRESOLINE) 50 MG tablet Take 1 tablet (50 mg total) by mouth every 8 (eight) hours., Starting Tue 12/15/2015, Normal    isosorbide mononitrate (IMDUR) 30 MG 24 hr tablet Take 1 tablet (30 mg total) by mouth daily., Starting  Tue 12/15/2015, Normal    levofloxacin (LEVAQUIN) 500 MG tablet Take 1 tablet (500 mg total) by mouth every other day., Starting Tue 12/15/2015, Until Sat 12/19/2015, Normal    predniSONE (DELTASONE) 10 MG tablet Take 50mg  daily for 3days,Take 40mg  daily for 3days,Take 30mg  daily for 3days,Take 20mg  daily for 3days,Take 10mg  daily for 3days,  then stop., Print      CONTINUE these medications which have CHANGED   Details  amLODipine (NORVASC) 10 MG tablet Take 1 tablet (10 mg total) by mouth at bedtime., Starting Tue 12/15/2015, Normal    metoprolol (LOPRESSOR) 50 MG tablet Take 1 tablet (50 mg total) by mouth 2 (two) times daily., Starting Tue 12/15/2015, Print      CONTINUE these medications which have NOT CHANGED   Details  albuterol (PROVENTIL HFA;VENTOLIN HFA) 108 (90 BASE) MCG/ACT inhaler Inhale 2 puffs into the lungs every 6 (six) hours as needed for wheezing or shortness of breath., Historical Med    aspirin EC 81 MG tablet Take 81 mg by mouth daily., Historical Med    clopidogrel (PLAVIX) 75 MG tablet Take 75 mg by mouth at bedtime.  , Historical Med    HYDROcodone-acetaminophen (NORCO/VICODIN) 5-325 MG tablet Take 1-2 tablets by mouth every 6 hours as needed for pain and/or cough., Print    rosuvastatin (CRESTOR) 10 MG tablet Take 40 mg by mouth at bedtime. , Historical Med      STOP taking these medications     ibuprofen (ADVIL,MOTRIN) 600 MG tablet        Allergies  Allergen Reactions  . Pork-Derived Products Other (See Comments)    Patient religious preference - no pork products   Discharge Instructions    (HEART FAILURE PATIENTS) Call MD:  Anytime you have any of the following symptoms: 1) 3 pound weight gain in 24 hours or 5 pounds in 1 week 2) shortness of breath, with or without a dry hacking cough 3) swelling in the hands, feet or stomach 4) if you have to sleep on extra pillows at night in order to breathe.    Complete by:  As directed    Ambulatory referral to Cardiology    Complete by:  As directed    Ambulatory referral to Nephrology    Complete by:  As directed    Call MD for:  difficulty breathing, headache or visual disturbances    Complete by:  As directed    Call MD for:  extreme fatigue    Complete by:  As directed    Call MD for:  persistant dizziness or light-headedness    Complete  by:  As directed    Call MD for:  severe uncontrolled pain    Complete by:  As directed    Diet - low sodium heart healthy    Complete by:  As directed    Discharge instructions    Complete by:  As directed    It is important that you read following instructions as well as go over your medication list with RN to help you understand your care after this hospitalization.  Discharge Instructions: Please follow-up with PCP in one week  Please request your primary care physician to go over all Hospital Tests and Procedure/Radiological results at the follow up,  Please get all Hospital records sent to your PCP by signing hospital release before you go home.   Do not drive, operating heavy machinery, perform activities at heights, swimming or participation in water activities or provide baby sitting  services; until you have been seen by Primary Care Physician or a Neurologist and advised to do so again. Do not take more than prescribed Pain, Sleep and Anxiety Medications. You were cared for by a hospitalist during your hospital stay. If you have any questions about your discharge medications or the care you received while you were in the hospital after you are discharged, you can call the unit and ask to speak with the hospitalist on call if the hospitalist that took care of you is not available.  Once you are discharged, your primary care physician will handle any further medical issues. Please note that NO REFILLS for any discharge medications will be authorized once you are discharged, as it is imperative that you return to your primary care physician (or establish a relationship with a primary care physician if you do not have one) for your aftercare needs so that they can reassess your need for medications and monitor your lab values. You Must read complete instructions/literature along with all the possible adverse reactions/side effects for all the Medicines you take and that have been  prescribed to you. Take any new Medicines after you have completely understood and accept all the possible adverse reactions/side effects. Wear Seat belts while driving. If you have smoked or chewed Tobacco in the last 2 yrs please stop smoking and/or stop any Recreational drug use.   Increase activity slowly    Complete by:  As directed      Discharge Exam: Filed Weights   12/13/15 2114 12/14/15 2045 12/15/15 0543  Weight: 74.8 kg (165 lb) 78.9 kg (174 lb) 78.1 kg (172 lb 1.6 oz)   Vitals:   12/15/15 0933 12/15/15 1454  BP: (!) 174/68 (!) 195/87  Pulse: 86 76  Resp:    Temp:     General: Appear in mild distress, no Rash; Oral Mucosa moist. Cardiovascular: S1 and S2 Present, no Murmur, no JVD Respiratory: Bilateral Air entry present and Clear to Auscultation, no Crackles, no wheezes Abdomen: Bowel Sound present, Soft and no tenderness Extremities: no Pedal edema, no calf tenderness Neurology: Grossly no focal neuro deficit.  The results of significant diagnostics from this hospitalization (including imaging, microbiology, ancillary and laboratory) are listed below for reference.    Significant Diagnostic Studies: Nm Pulmonary Perf And Vent  Result Date: 12/15/2015 CLINICAL DATA:  Shortness of breath, positive D-dimer. EXAM: NUCLEAR MEDICINE VENTILATION - PERFUSION LUNG SCAN TECHNIQUE: Ventilation images were obtained in multiple projections using inhaled aerosol Tc-67m DTPA. Perfusion images were obtained in multiple projections after intravenous injection of Tc-70m MAA. RADIOPHARMACEUTICALS:  32.0 mCi Technetium-42m DTPA aerosol inhalation and 4.1 mCi Technetium-79m MAA IV COMPARISON:  Chest x-ray 12/15/2015 FINDINGS: Ventilation: Patchy nonsegmental ventilation defects. Perfusion: No wedge shaped peripheral perfusion defects to suggest acute pulmonary embolism. Patchy nonsegmental perfusion defects. IMPRESSION: Low probability study for pulmonary embolus. Electronically Signed   By:  Charlett Nose M.D.   On: 12/15/2015 15:01   Dg Chest Port 1 View  Result Date: 12/15/2015 CLINICAL DATA:  Abnormal lung scan EXAM: PORTABLE CHEST 1 VIEW COMPARISON:  12/13/2015 FINDINGS: Bilateral airspace opacities have improved, likely improving edema or pneumonia. Small left pleural effusion. Borderline heart size. No acute bony abnormality. IMPRESSION: Improving bilateral opacities, likely improving edema or pneumonia. Small left effusion. Electronically Signed   By: Charlett Nose M.D.   On: 12/15/2015 13:16   Dg Chest Portable 1 View  Result Date: 12/13/2015 CLINICAL DATA:  Respiratory distress EXAM: PORTABLE CHEST 1 VIEW COMPARISON:  05/10/2014 FINDINGS: Mild cardiac enlargement. Diffuse bilateral airspace and interstitial infiltrates, predominantly perihilar in distribution. Changes suggest edema or diffuse pneumonia. No blunting of costophrenic angles. No pneumothorax. Calcification of the aorta. Degenerative changes in the spine and shoulders. IMPRESSION: Mild cardiac enlargement. Diffuse bilateral pulmonary infiltrates suggesting edema or bilateral pneumonia. Electronically Signed   By: Burman NievesWilliam  Stevens M.D.   On: 12/13/2015 21:25    Microbiology: Recent Results (from the past 240 hour(s))  Culture, blood (Routine X 2) w Reflex to ID Panel     Status: None (Preliminary result)   Collection Time: 12/13/15  9:05 PM  Result Value Ref Range Status   Specimen Description BLOOD RIGHT ARM  Final   Special Requests BOTTLES DRAWN AEROBIC AND ANAEROBIC 5CC  Final   Culture NO GROWTH 4 DAYS  Final   Report Status PENDING  Incomplete  Blood culture (routine x 2)     Status: None (Preliminary result)   Collection Time: 12/13/15 10:42 PM  Result Value Ref Range Status   Specimen Description BLOOD RIGHT HAND  Final   Special Requests BOTTLES DRAWN AEROBIC AND ANAEROBIC 5CC  Final   Culture NO GROWTH 4 DAYS  Final   Report Status PENDING  Incomplete  MRSA PCR Screening     Status: None    Collection Time: 12/14/15  1:23 PM  Result Value Ref Range Status   MRSA by PCR NEGATIVE NEGATIVE Final    Comment:        The GeneXpert MRSA Assay (FDA approved for NASAL specimens only), is one component of a comprehensive MRSA colonization surveillance program. It is not intended to diagnose MRSA infection nor to guide or monitor treatment for MRSA infections.      Labs: CBC:  Recent Labs Lab 12/13/15 2100 12/15/15 0216  WBC 17.1* 22.3*  NEUTROABS 10.4* 16.7*  HGB 15.3 12.4*  HCT 48.0 37.8*  MCV 82.3 80.6  PLT 354 298   Basic Metabolic Panel:  Recent Labs Lab 12/13/15 2100 12/14/15 0303 12/15/15 0216  NA 140 138 138  K 4.8 4.2 4.0  CL 104 108 107  CO2 29 22 24   GLUCOSE 174* 172* 115*  BUN 18 22* 34*  CREATININE 1.83* 1.65* 1.58*  CALCIUM 8.6* 8.4* 8.7*  MG  --   --  2.0   Liver Function Tests:  Recent Labs Lab 12/13/15 2100 12/15/15 0216  AST 30 18  ALT 20 14*  ALKPHOS 117 80  BILITOT 0.7 0.2*  PROT 7.4 6.3*  ALBUMIN 3.7 3.1*   No results for input(s): LIPASE, AMYLASE in the last 168 hours. No results for input(s): AMMONIA in the last 168 hours. Cardiac Enzymes:  Recent Labs Lab 12/14/15 0303 12/14/15 1408 12/14/15 1929 12/15/15 0205  TROPONINI 0.08* 0.08* 0.10* 0.10*   BNP (last 3 results)  Recent Labs  12/13/15 2100  BNP 188.1*   CBG: No results for input(s): GLUCAP in the last 168 hours. Time spent: 30 minutes  Signed:  Lynden OxfordEL, Elvenia Godden  [facility] 12/15/2015 , 6:32 AM

## 2016-08-17 DEATH — deceased

## 2018-10-14 IMAGING — CR DG CHEST 1V PORT
1 series · 1 of 1 positions shown · non-contrast
Comparison: 05/10/2014

CLINICAL DATA: Respiratory distress

EXAM:
PORTABLE CHEST 1 VIEW

[AP]
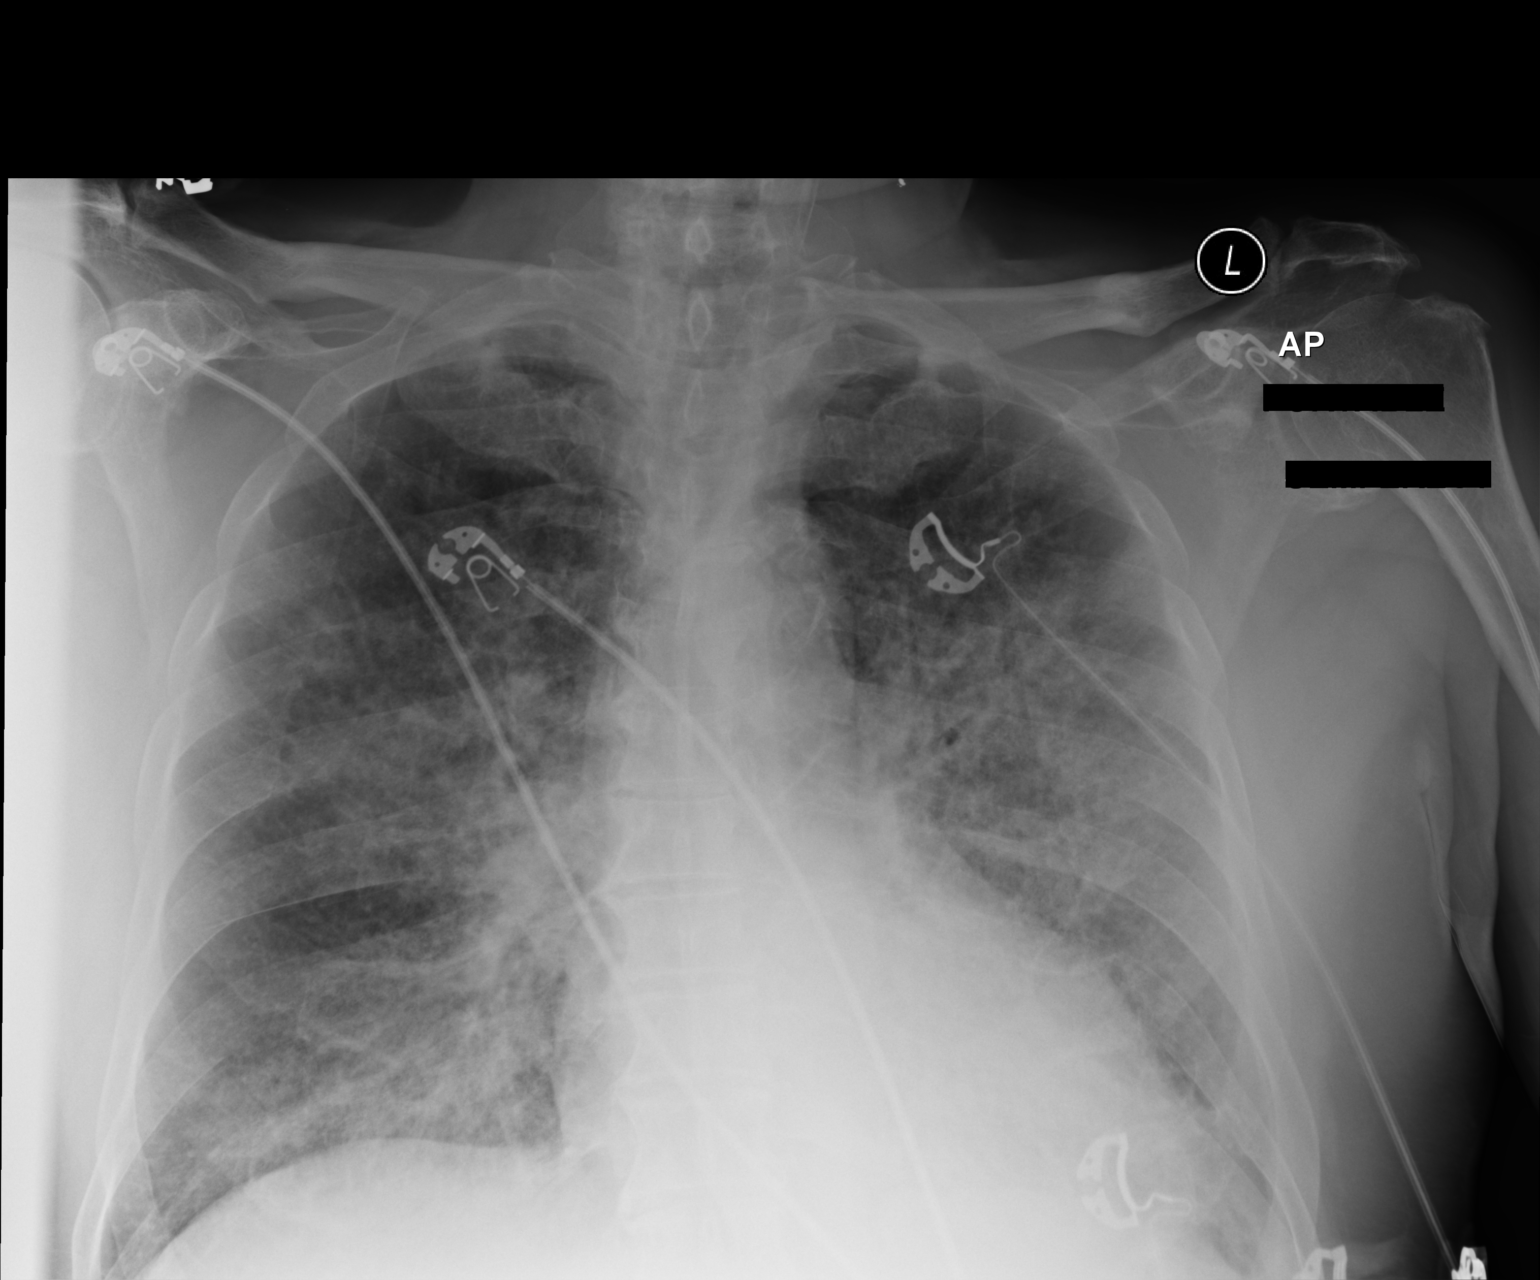

[1 of 1 positions shown; findings below may reference images not displayed]

FINDINGS: Mild cardiac enlargement. Diffuse bilateral airspace and
interstitial infiltrates, predominantly perihilar in distribution.
Changes suggest edema or diffuse pneumonia. No blunting of
costophrenic angles. No pneumothorax. Calcification of the aorta.
Degenerative changes in the spine and shoulders.
IMPRESSION: Mild cardiac enlargement. Diffuse bilateral pulmonary infiltrates
suggesting edema or bilateral pneumonia.

## 2018-10-16 IMAGING — NM NM PULMONARY VENT & PERF
16 series · 16 of 16 positions shown · non-contrast
Comparison: Chest x-ray 12/15/2015

CLINICAL DATA: Shortness of breath, positive D-dimer.

EXAM:
NUCLEAR MEDICINE VENTILATION - PERFUSION LUNG SCAN
TECHNIQUE: Ventilation images were obtained in multiple projections using
inhaled aerosol Bc-AAm DTPA. Perfusion images were obtained in
multiple projections after intravenous injection of Bc-AAm MAA.
RADIOPHARMACEUTICALS:  32.0 mCi Yechnetium-AAm DTPA aerosol
inhalation and 4.1 mCi Yechnetium-AAm MAA IV

[Series 1: ant/post vent · 4.14mm/px · 1 of 1 slices shown (1 of 2)]
[im 1/1]
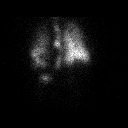

[Series 1: ant/post vent · 4.14mm/px · 1 of 1 slices shown (2 of 2)]
[im 1/1]
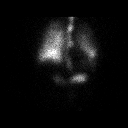

[Series 2: lao/rpo vent · 4.14mm/px · 1 of 1 slices shown (1 of 2)]
[im 1/1]
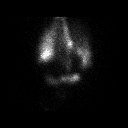

[Series 2: lao/rpo vent · 4.14mm/px · 1 of 1 slices shown (2 of 2)]
[im 1/1]
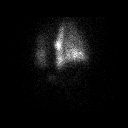

[Series 3: lpo/rao vent · 4.14mm/px · 1 of 1 slices shown (1 of 2)]
[im 1/1]
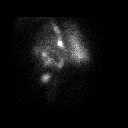

[Series 3: lpo/rao vent · 4.14mm/px · 1 of 1 slices shown (2 of 2)]
[im 1/1]
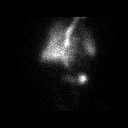

[Series 4: lt lat/rt lat vent · 4.14mm/px · 1 of 1 slices shown (1 of 2)]
[im 1/1]
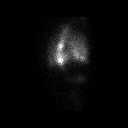

[Series 4: lt lat/rt lat vent · 4.14mm/px · 1 of 1 slices shown (2 of 2)]
[im 1/1]
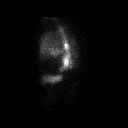

[Series 5: lt lat/rt lat perf · 4.14mm/px · 1 of 1 slices shown (1 of 2)]
[im 1/1]
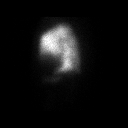

[Series 5: lt lat/rt lat perf · 4.14mm/px · 1 of 1 slices shown (2 of 2)]
[im 1/1]
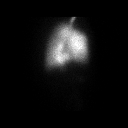

[Series 6: lpo/rao perf · 4.14mm/px · 1 of 1 slices shown (1 of 2)]
[im 1/1]
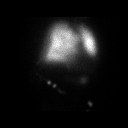

[Series 6: lpo/rao perf · 4.14mm/px · 1 of 1 slices shown (2 of 2)]
[im 1/1]
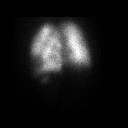

[Series 7: ant/post perf · 4.14mm/px · 1 of 1 slices shown (1 of 2)]
[im 1/1]
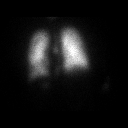

[Series 7: ant/post perf · 4.14mm/px · 1 of 1 slices shown (2 of 2)]
[im 1/1]
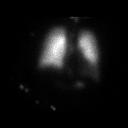

[Series 8: lao/rpo perf · 4.14mm/px · 1 of 1 slices shown (1 of 2)]
[im 1/1]
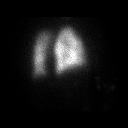

[Series 8: lao/rpo perf · 4.14mm/px · 1 of 1 slices shown (2 of 2)]
[im 1/1]
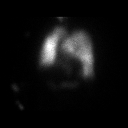

[16 of 16 positions shown; findings below may reference images not displayed]

FINDINGS: Ventilation: Patchy nonsegmental ventilation defects.

Perfusion: No wedge shaped peripheral perfusion defects to suggest
acute pulmonary embolism. Patchy nonsegmental perfusion defects.
IMPRESSION: Low probability study for pulmonary embolus.
